# Patient Record
Sex: Female | Born: 1985 | Race: Black or African American | Hispanic: No | Marital: Single | Smoking: Former smoker
Health system: Southern US, Community
[De-identification: ages and names within clinical notes are randomized; demographics above are authoritative.]

## PROBLEM LIST (undated history)

## (undated) DIAGNOSIS — N390 Urinary tract infection, site not specified: Secondary | ICD-10-CM

## (undated) DIAGNOSIS — N83209 Unspecified ovarian cyst, unspecified side: Secondary | ICD-10-CM

## (undated) DIAGNOSIS — R519 Headache, unspecified: Secondary | ICD-10-CM

## (undated) DIAGNOSIS — D649 Anemia, unspecified: Secondary | ICD-10-CM

## (undated) DIAGNOSIS — R87629 Unspecified abnormal cytological findings in specimens from vagina: Secondary | ICD-10-CM

## (undated) DIAGNOSIS — F419 Anxiety disorder, unspecified: Secondary | ICD-10-CM

## (undated) DIAGNOSIS — F32A Depression, unspecified: Secondary | ICD-10-CM

## (undated) HISTORY — PX: WISDOM TOOTH EXTRACTION: SHX21

## (undated) HISTORY — PX: DILATION AND CURETTAGE OF UTERUS: SHX78

---

## 2015-07-14 ENCOUNTER — Other Ambulatory Visit: Payer: Self-pay | Admitting: Occupational Medicine

## 2015-07-14 ENCOUNTER — Ambulatory Visit: Payer: Self-pay

## 2015-07-14 DIAGNOSIS — Z0000 Encounter for general adult medical examination without abnormal findings: Secondary | ICD-10-CM

## 2015-12-29 DIAGNOSIS — Z3042 Encounter for surveillance of injectable contraceptive: Secondary | ICD-10-CM | POA: Diagnosis not present

## 2016-07-02 ENCOUNTER — Other Ambulatory Visit: Payer: Self-pay | Admitting: Occupational Medicine

## 2016-07-02 ENCOUNTER — Ambulatory Visit: Payer: Self-pay

## 2016-07-02 DIAGNOSIS — Z0000 Encounter for general adult medical examination without abnormal findings: Secondary | ICD-10-CM

## 2016-08-11 DIAGNOSIS — R7309 Other abnormal glucose: Secondary | ICD-10-CM | POA: Diagnosis not present

## 2016-08-11 DIAGNOSIS — N926 Irregular menstruation, unspecified: Secondary | ICD-10-CM | POA: Diagnosis not present

## 2016-08-11 DIAGNOSIS — N920 Excessive and frequent menstruation with regular cycle: Secondary | ICD-10-CM | POA: Diagnosis not present

## 2017-02-25 IMAGING — CR DG CHEST 2V
2 series · 2 of 2 positions shown · non-contrast
Comparison: None.

CLINICAL DATA: Physical exam.  Nonsmoker.

EXAM:
CHEST  2 VIEW

[view not recorded (1 of 2)]
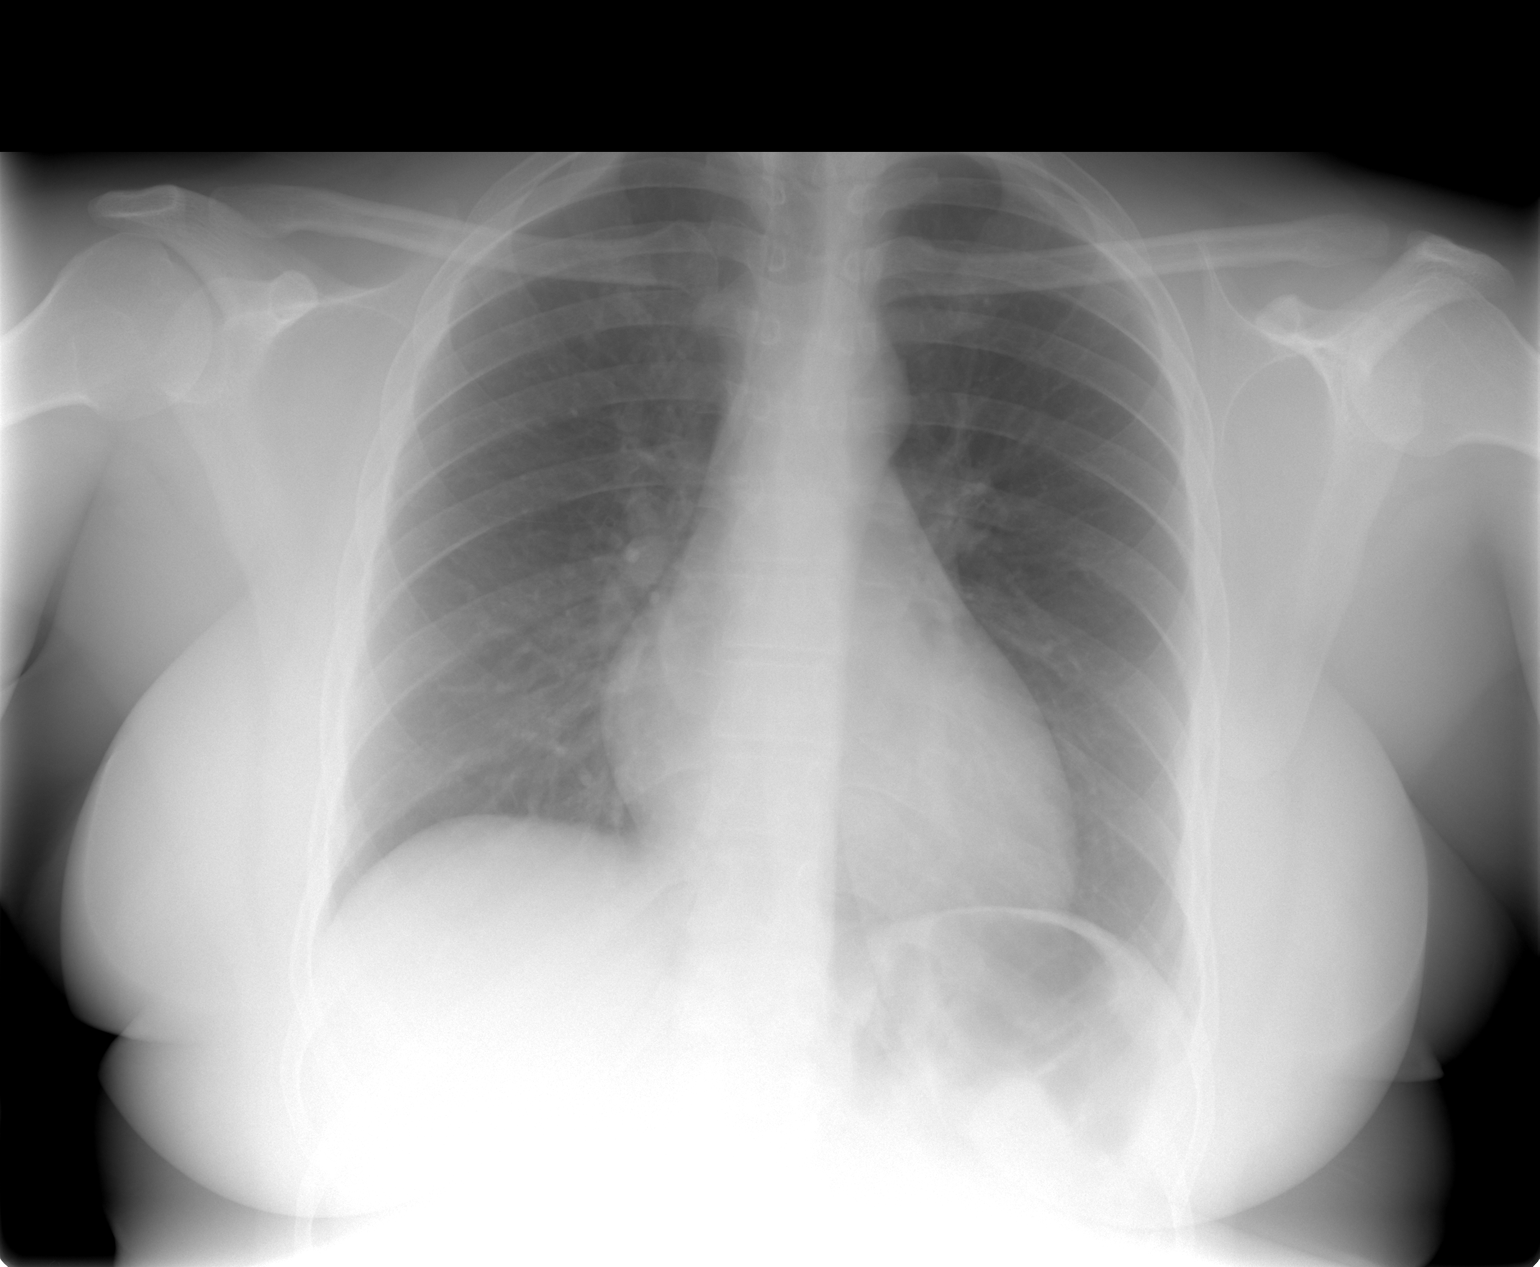

[view not recorded (2 of 2)]
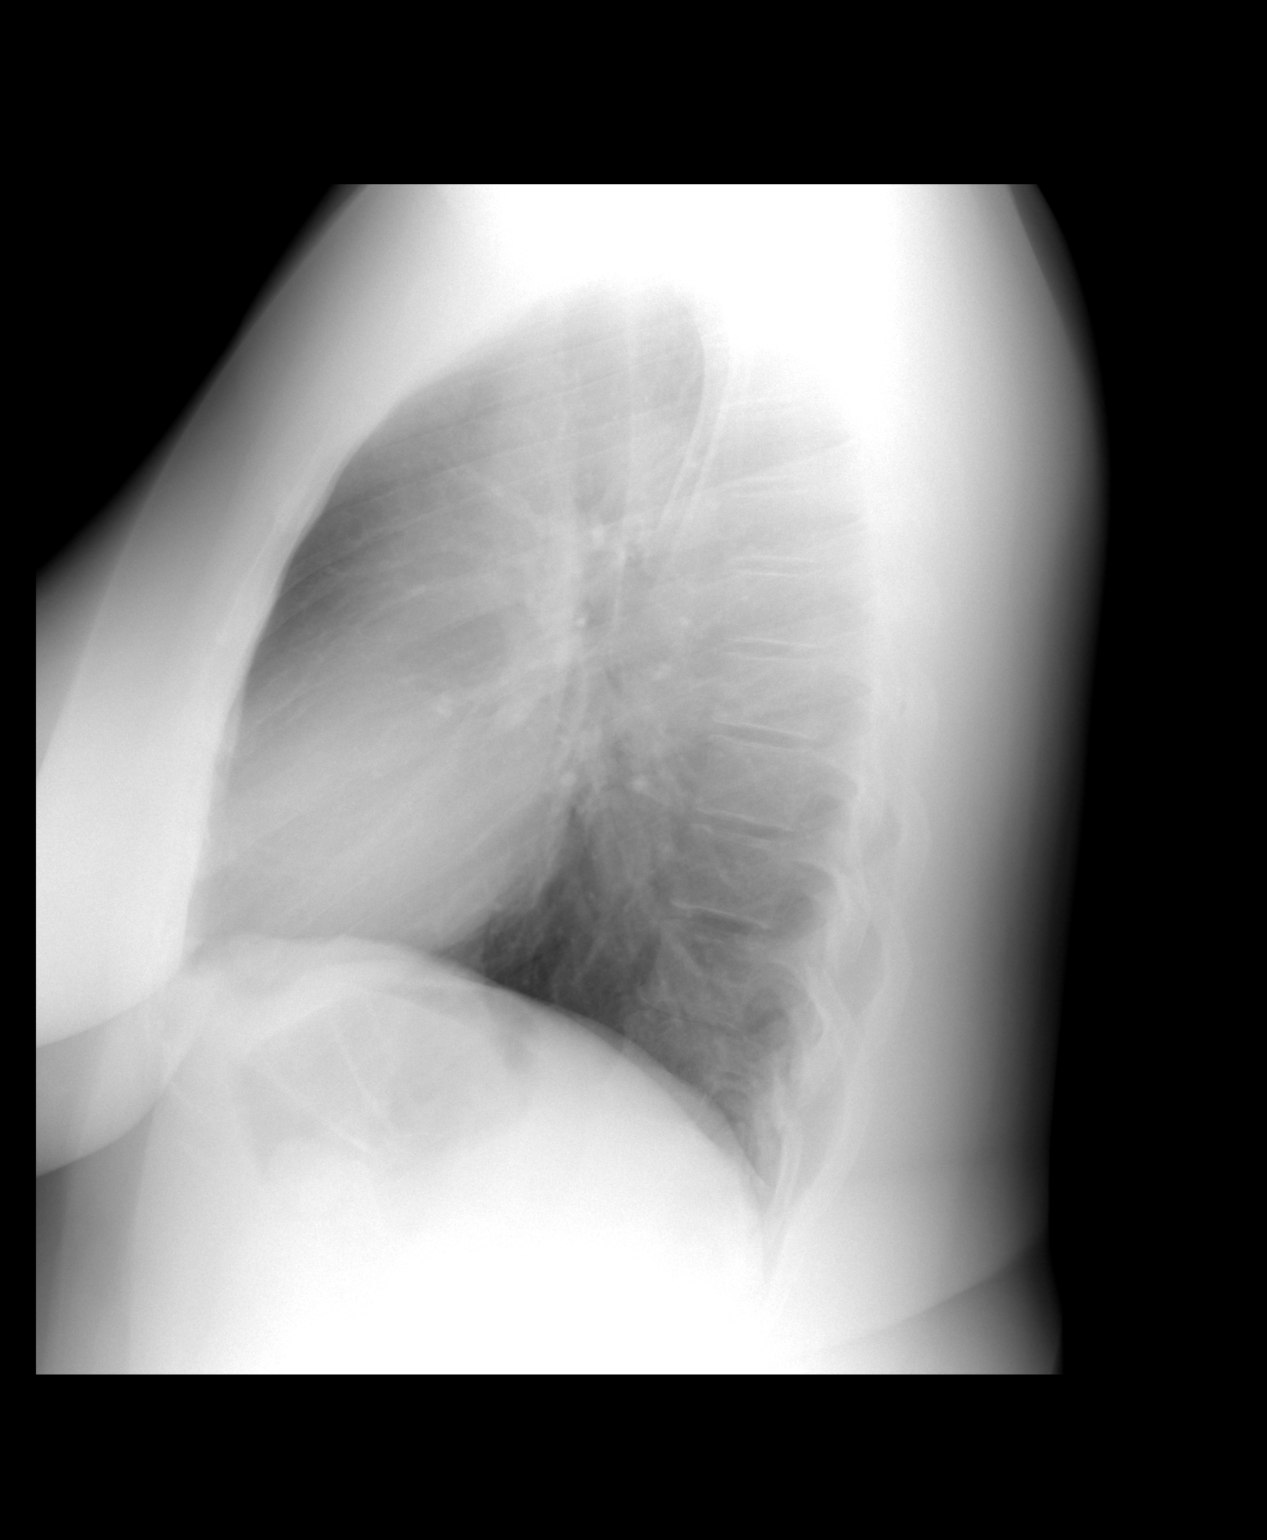

[2 of 2 positions shown; findings below may reference images not displayed]

FINDINGS: The heart size and mediastinal contours are within normal limits.
Both lungs are clear. The visualized skeletal structures are
unremarkable.
IMPRESSION: No active cardiopulmonary disease.

## 2017-05-23 DIAGNOSIS — J0100 Acute maxillary sinusitis, unspecified: Secondary | ICD-10-CM | POA: Diagnosis not present

## 2017-11-11 DIAGNOSIS — Z114 Encounter for screening for human immunodeficiency virus [HIV]: Secondary | ICD-10-CM | POA: Diagnosis not present

## 2017-11-11 DIAGNOSIS — Z13220 Encounter for screening for lipoid disorders: Secondary | ICD-10-CM | POA: Diagnosis not present

## 2017-11-11 DIAGNOSIS — Z131 Encounter for screening for diabetes mellitus: Secondary | ICD-10-CM | POA: Diagnosis not present

## 2017-11-11 DIAGNOSIS — Z1159 Encounter for screening for other viral diseases: Secondary | ICD-10-CM | POA: Diagnosis not present

## 2017-11-11 DIAGNOSIS — Z130 Encounter for screening for diseases of the blood and blood-forming organs and certain disorders involving the immune mechanism: Secondary | ICD-10-CM | POA: Diagnosis not present

## 2017-11-11 DIAGNOSIS — Z01419 Encounter for gynecological examination (general) (routine) without abnormal findings: Secondary | ICD-10-CM | POA: Diagnosis not present

## 2017-11-11 DIAGNOSIS — Z113 Encounter for screening for infections with a predominantly sexual mode of transmission: Secondary | ICD-10-CM | POA: Diagnosis not present

## 2017-11-11 DIAGNOSIS — Z0000 Encounter for general adult medical examination without abnormal findings: Secondary | ICD-10-CM | POA: Diagnosis not present

## 2017-11-11 DIAGNOSIS — Z1151 Encounter for screening for human papillomavirus (HPV): Secondary | ICD-10-CM | POA: Diagnosis not present

## 2017-11-11 DIAGNOSIS — Z118 Encounter for screening for other infectious and parasitic diseases: Secondary | ICD-10-CM | POA: Diagnosis not present

## 2017-11-11 DIAGNOSIS — Z1329 Encounter for screening for other suspected endocrine disorder: Secondary | ICD-10-CM | POA: Diagnosis not present

## 2017-11-11 DIAGNOSIS — Z6835 Body mass index (BMI) 35.0-35.9, adult: Secondary | ICD-10-CM | POA: Diagnosis not present

## 2018-02-14 IMAGING — CR DG CHEST 2V
2 series · 2 of 2 positions shown · non-contrast
Comparison: July 14, 2015

CLINICAL DATA: Physical examination

EXAM:
CHEST  2 VIEW

[view not recorded (1 of 2)]
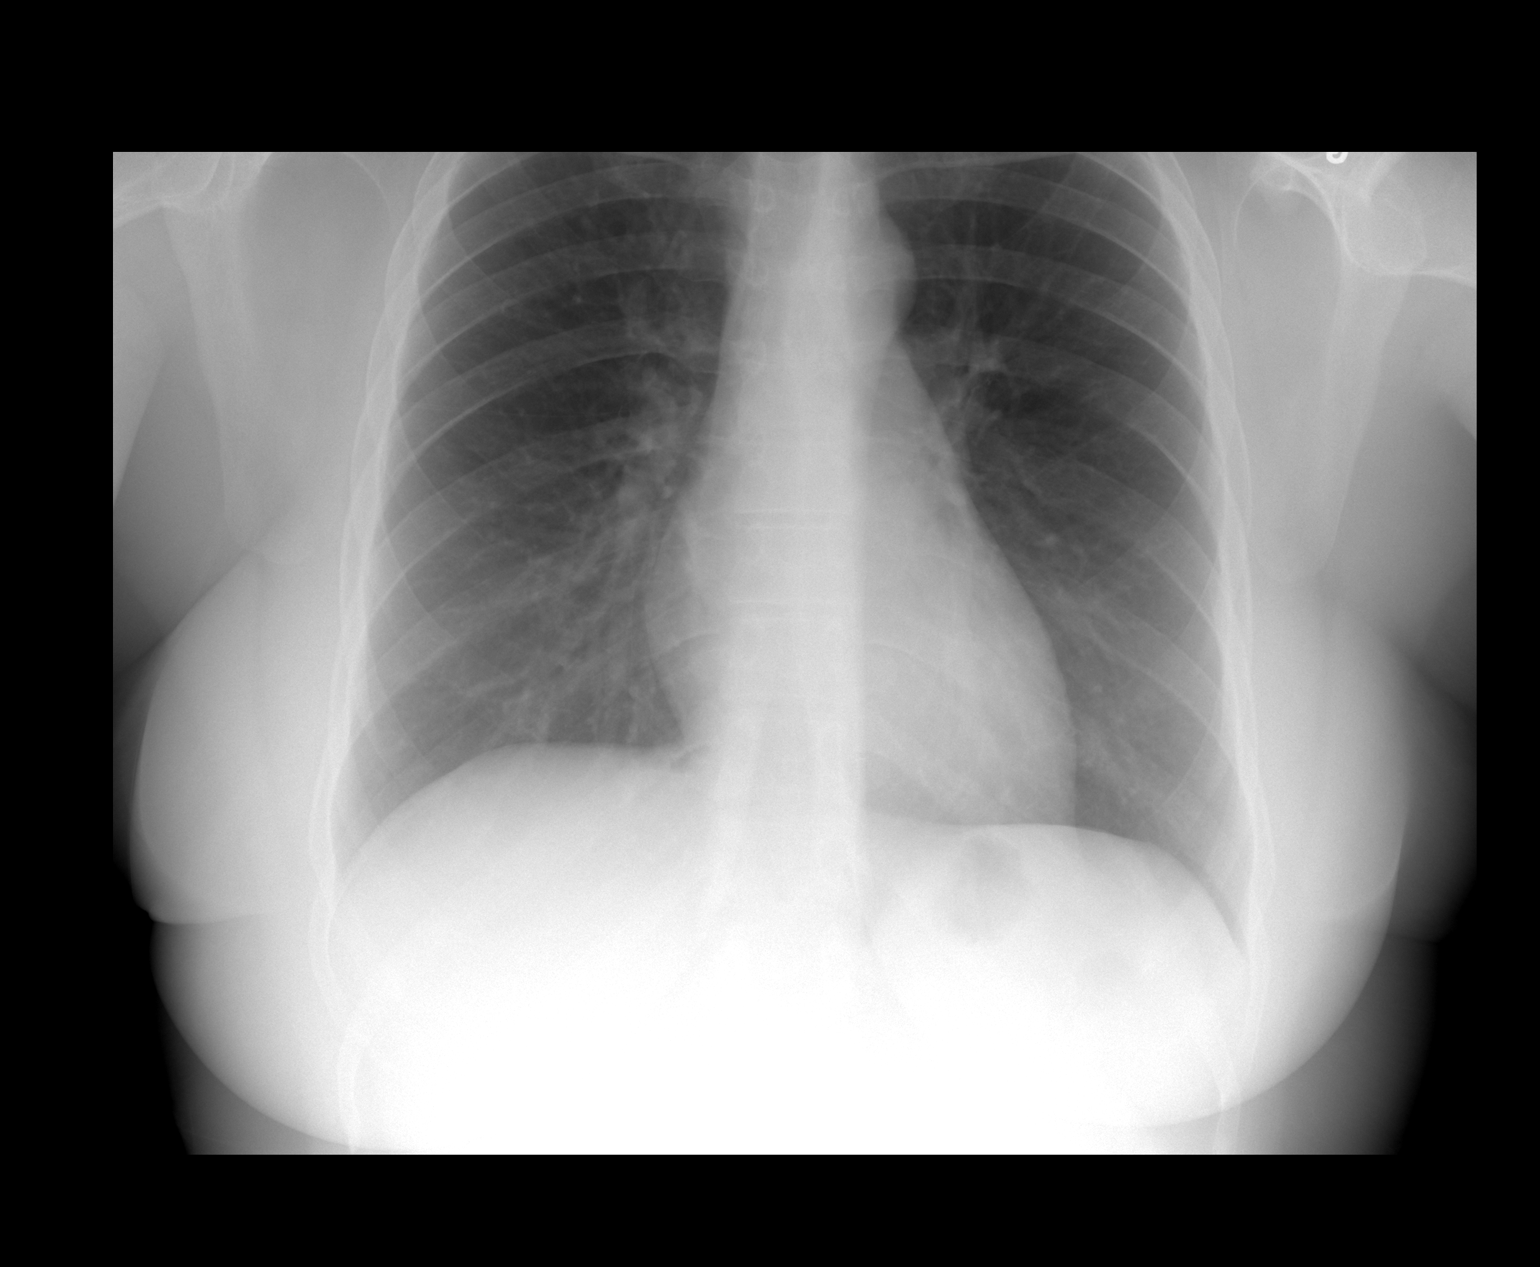

[view not recorded (2 of 2)]
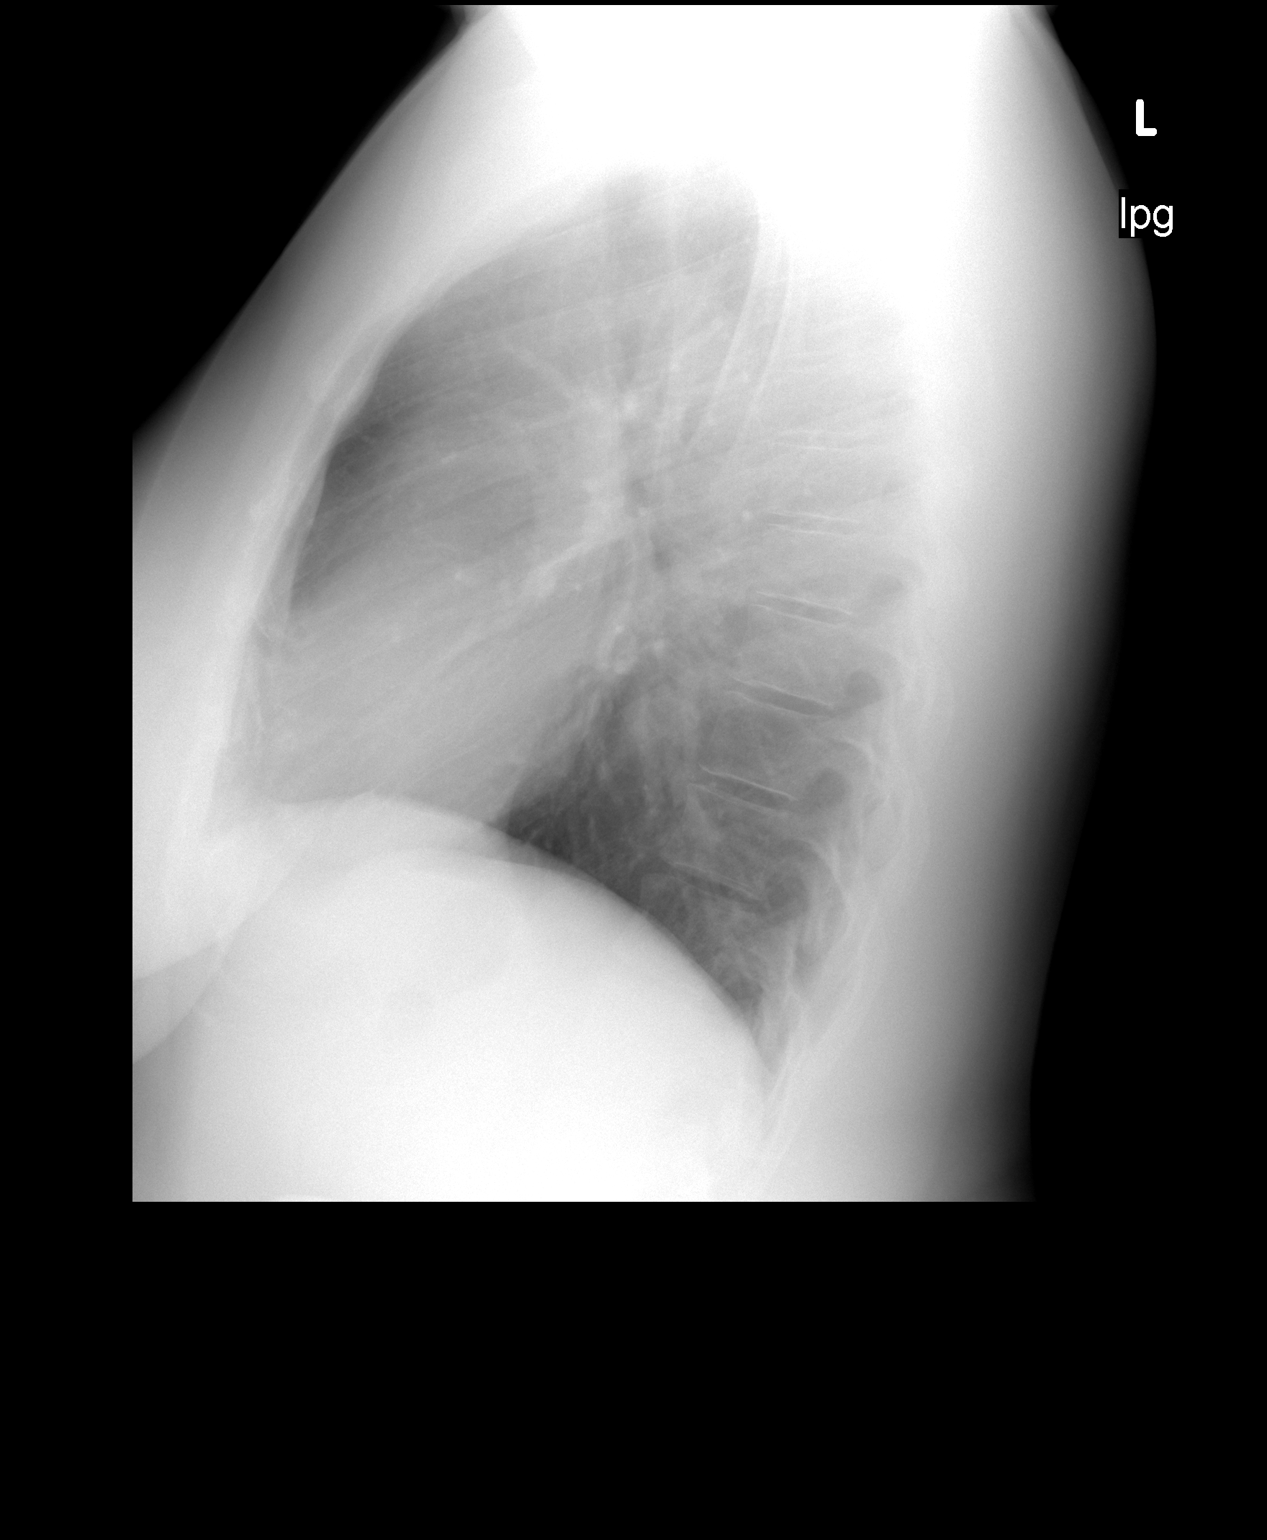

[2 of 2 positions shown; findings below may reference images not displayed]

FINDINGS: Lungs are clear. Heart size and pulmonary vascularity are normal. No
adenopathy. No bone lesions.
IMPRESSION: No abnormality noted.

## 2018-12-19 ENCOUNTER — Other Ambulatory Visit: Payer: Self-pay

## 2018-12-19 ENCOUNTER — Ambulatory Visit
Admission: RE | Admit: 2018-12-19 | Discharge: 2018-12-19 | Disposition: A | Discharge status: Home / Self Care | Payer: BC Managed Care – PPO | Source: Ambulatory Visit | Attending: Family Medicine | Admitting: Family Medicine

## 2018-12-19 ENCOUNTER — Other Ambulatory Visit: Payer: Self-pay | Admitting: Family Medicine

## 2018-12-19 DIAGNOSIS — Z201 Contact with and (suspected) exposure to tuberculosis: Secondary | ICD-10-CM

## 2020-09-05 ENCOUNTER — Other Ambulatory Visit: Payer: Self-pay | Admitting: Obstetrics and Gynecology

## 2020-09-05 DIAGNOSIS — N920 Excessive and frequent menstruation with regular cycle: Secondary | ICD-10-CM

## 2020-09-16 ENCOUNTER — Ambulatory Visit
Admission: RE | Admit: 2020-09-16 | Discharge: 2020-09-16 | Disposition: A | Discharge status: Home / Self Care | Payer: BC Managed Care – PPO | Source: Ambulatory Visit | Attending: Obstetrics and Gynecology | Admitting: Obstetrics and Gynecology

## 2020-09-16 DIAGNOSIS — N920 Excessive and frequent menstruation with regular cycle: Secondary | ICD-10-CM

## 2022-05-01 IMAGING — US US PELVIS COMPLETE WITH TRANSVAGINAL
1 series · 13 of 25 positions shown · non-contrast
Comparison: None

CLINICAL DATA: Menorrhagia with regular cycle, passing clots for 3
months, LMP 08/25/2020



[Series 1: us pelvis complete with transvaginal · 0.26mm/px · 81 acquisitions, 13 frames shown]
[im 1/81]
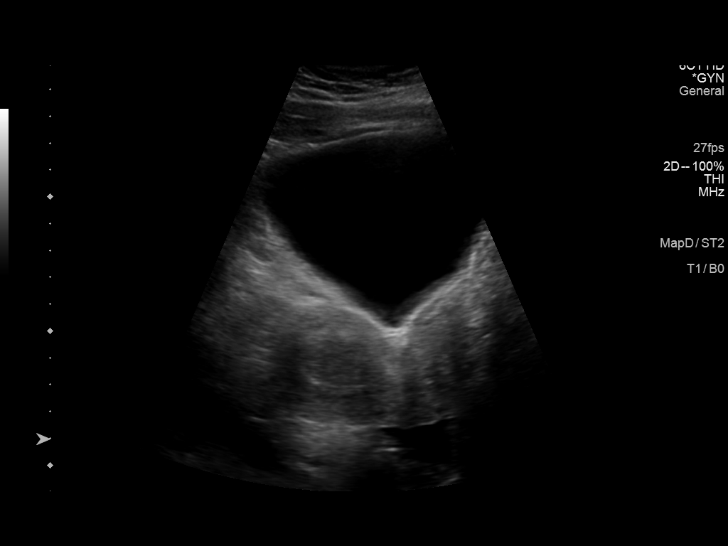
[im 7/81]
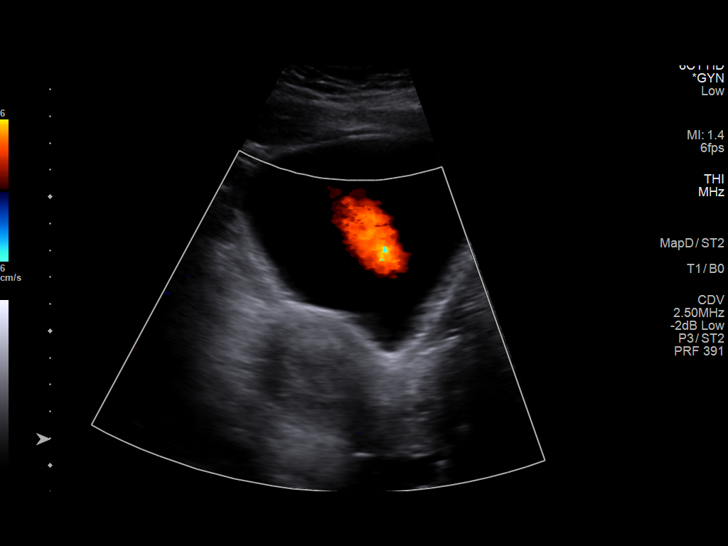
[im 14/81]
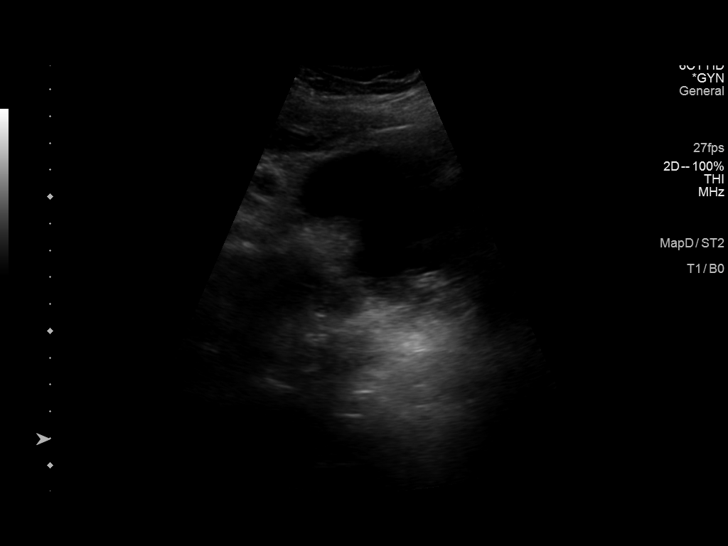
[im 21/81]
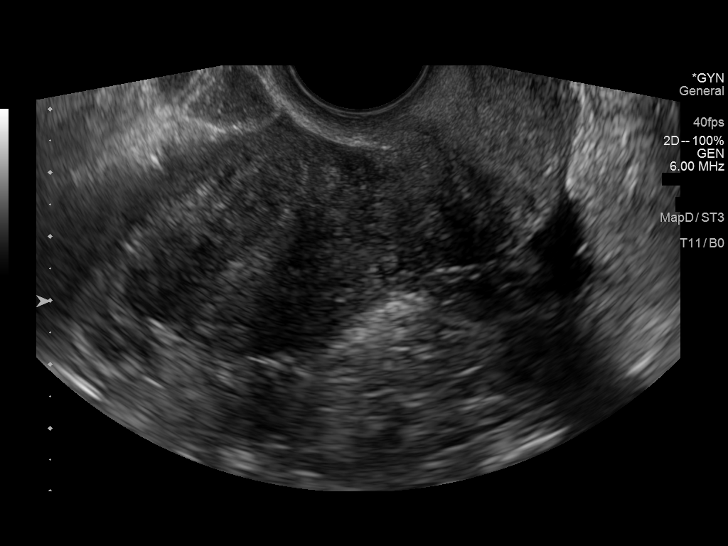
[im 27/81]
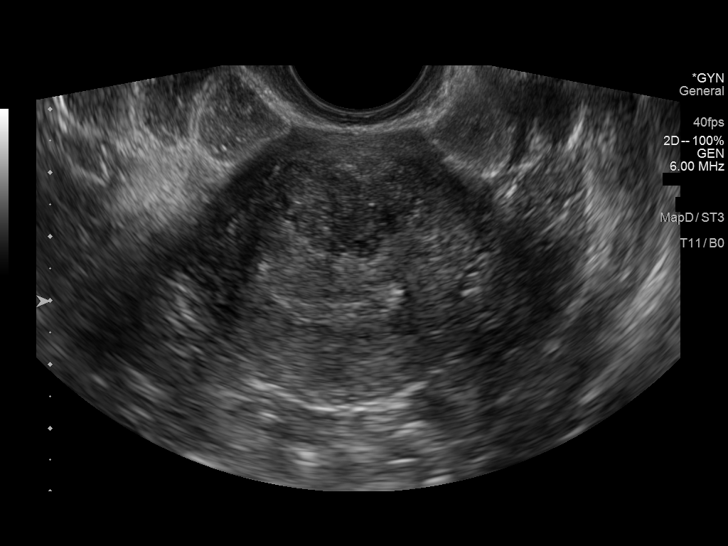
[im 34/81]
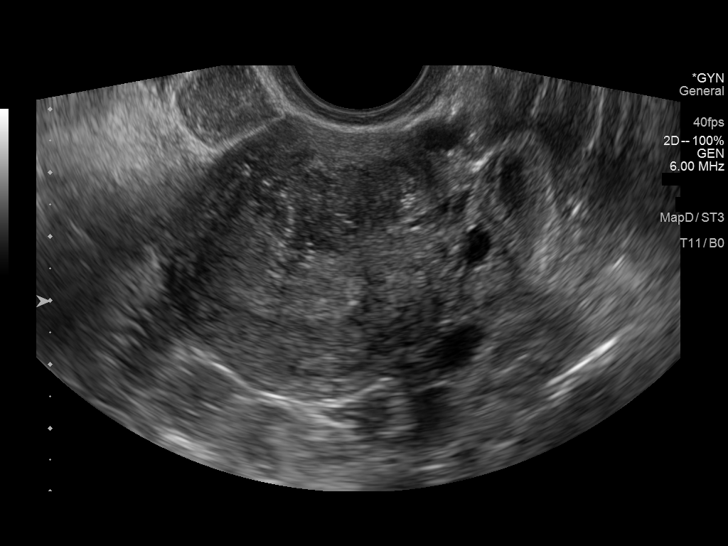
[im 41/81]
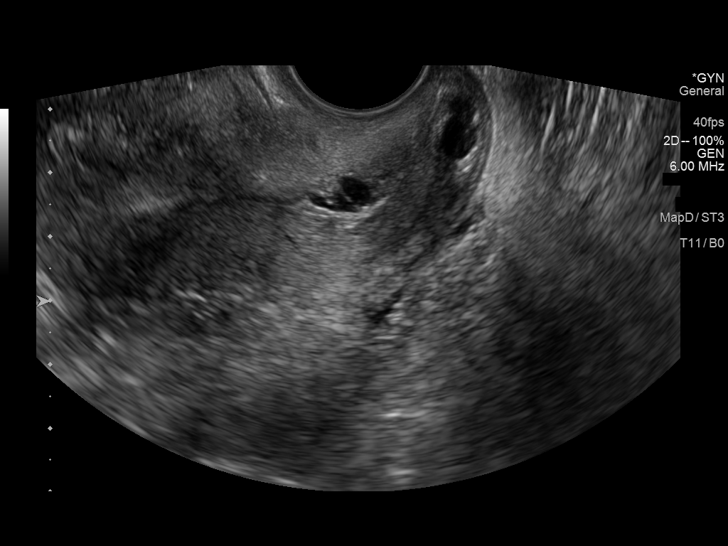
[im 47/81]
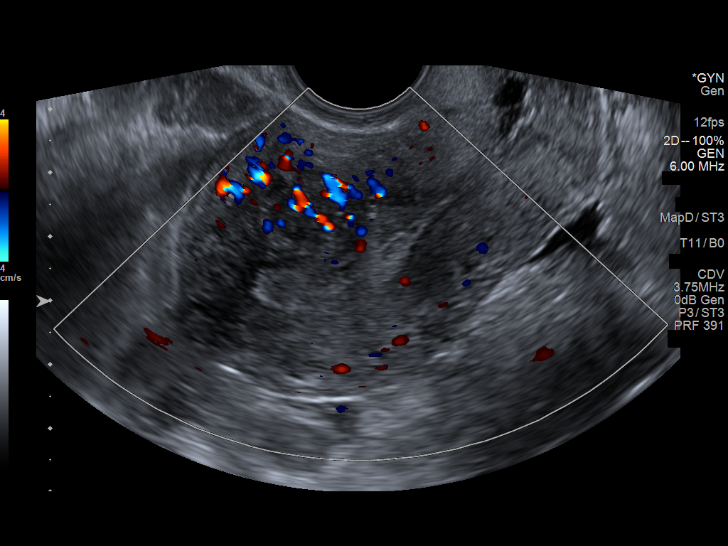
[im 54/81]
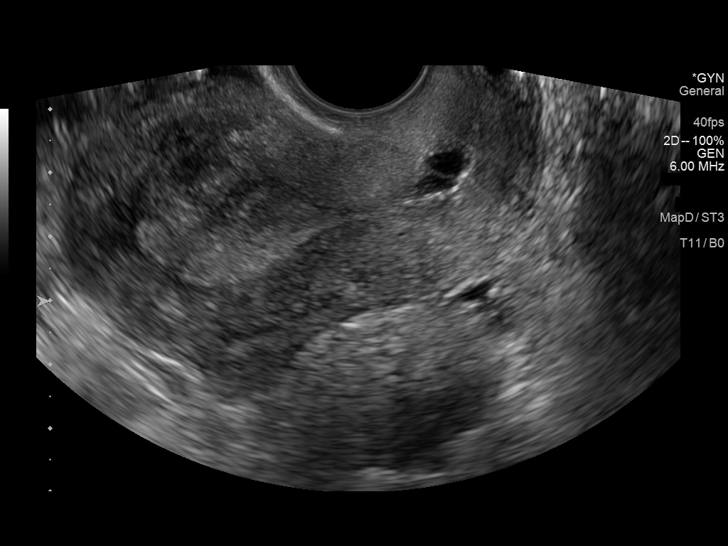
[im 61/81]
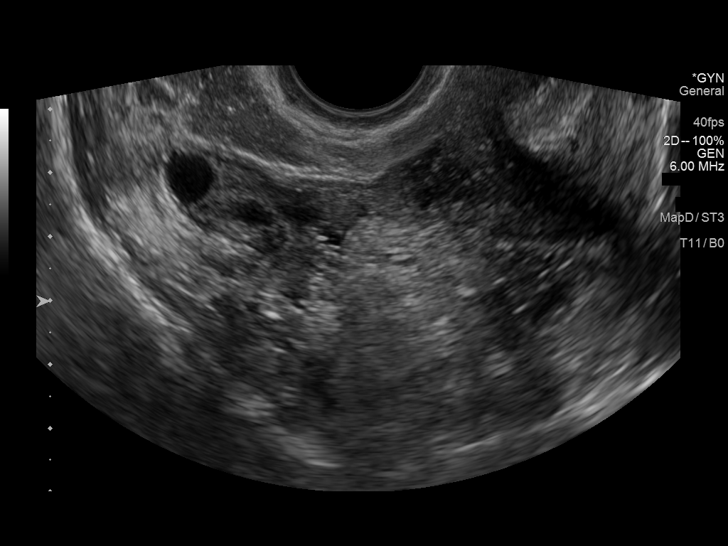
[im 67/81]
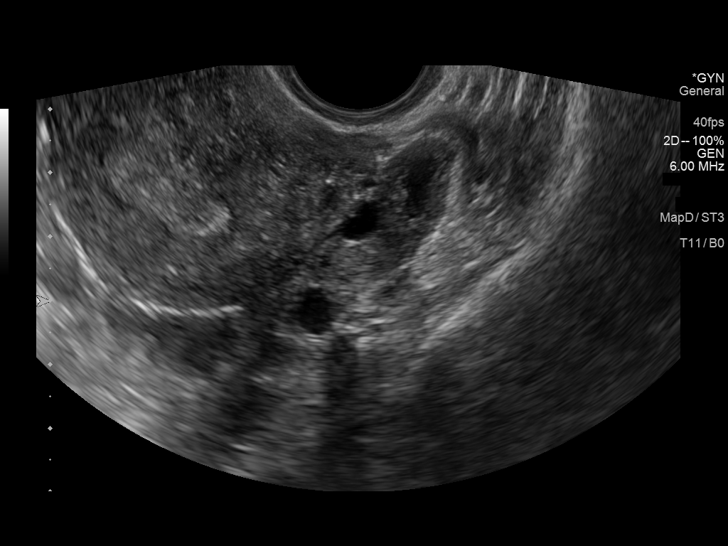
[im 74/81]
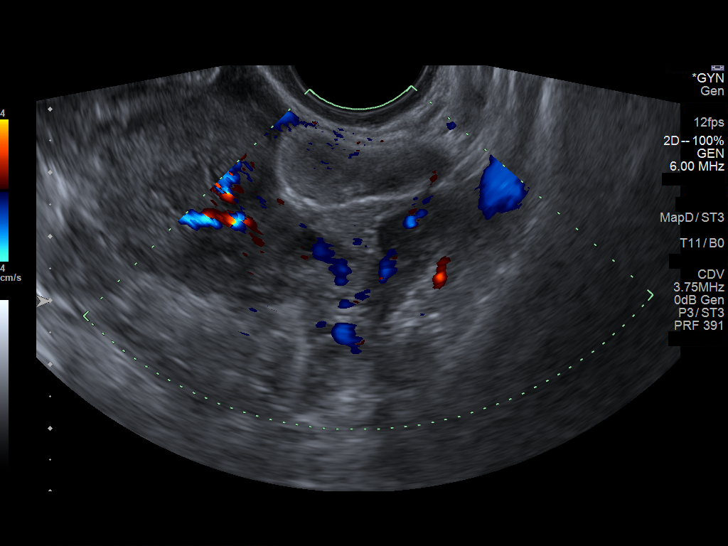
[im 81/81]
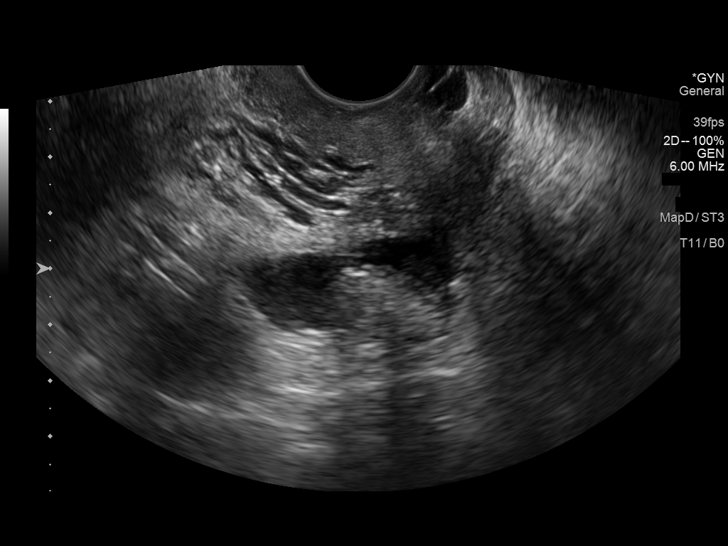

[13 of 25 positions shown; findings below may reference images not displayed]

FINDINGS: Uterus

Measurements: 7.6 x 4.1 x 4.0 cm = volume: 65 mL. Heterogeneous
myometrium anteriorly, could represent a poorly defined leiomyoma
extending 2.6 cm diameter versus an area of focal adenomyosis. This
area is mildly hypervascular on color Doppler imaging. No additional
masses.

Endometrium

Thickness: 10 mm. Ill-defined anterior margin of the endometrial
complex adjacent to the area of myometrial heterogeneity, favor
adenomyosis. No discrete endometrial mass or fluid.

Right ovary

Measurements: 2.6 x 1.0 x 1.7 cm = volume: 2.4 mL. Normal morphology
without mass

Left ovary

Measurements: 3.5 x 1.8 x 1.5 cm and = volume: 5.0 mL. Small corpus
luteum; no follow-up imaging recommended. Otherwise normal normal
morphology without mass

Other findings

Trace free pelvic fluid.  No adnexal masses.
IMPRESSION: Unremarkable ovaries and adnexa.

Ill-defined anterior margin of the endometrial complex favoring
adenomyosis.

Poorly defined 2.6 cm diameter hypoechoic area within anterior
myometrium, associated with ill-defined adjacent margin of the
endometrial complex, favor adenomyosis over ill-defined leiomyoma;
this could be confirmed by MR imaging if clinically indicated.

No other pelvic sonographic abnormalities identified.

## 2023-01-11 ENCOUNTER — Inpatient Hospital Stay (HOSPITAL_COMMUNITY)
Admission: AD | Admit: 2023-01-11 | Discharge: 2023-01-11 | Disposition: A | Discharge status: Home / Self Care | Payer: 59 | Attending: Obstetrics and Gynecology | Admitting: Obstetrics and Gynecology

## 2023-01-11 ENCOUNTER — Encounter (HOSPITAL_COMMUNITY): Payer: Self-pay

## 2023-01-11 DIAGNOSIS — O219 Vomiting of pregnancy, unspecified: Secondary | ICD-10-CM

## 2023-01-11 DIAGNOSIS — Z3A09 9 weeks gestation of pregnancy: Secondary | ICD-10-CM | POA: Insufficient documentation

## 2023-01-11 DIAGNOSIS — O210 Mild hyperemesis gravidarum: Secondary | ICD-10-CM | POA: Diagnosis present

## 2023-01-11 HISTORY — DX: Depression, unspecified: F32.A

## 2023-01-11 HISTORY — DX: Unspecified abnormal cytological findings in specimens from vagina: R87.629

## 2023-01-11 HISTORY — DX: Headache, unspecified: R51.9

## 2023-01-11 HISTORY — DX: Urinary tract infection, site not specified: N39.0

## 2023-01-11 HISTORY — DX: Anxiety disorder, unspecified: F41.9

## 2023-01-11 HISTORY — DX: Unspecified ovarian cyst, unspecified side: N83.209

## 2023-01-11 HISTORY — DX: Anemia, unspecified: D64.9

## 2023-01-11 LAB — URINALYSIS, ROUTINE W REFLEX MICROSCOPIC
Bacteria, UA: NONE SEEN
Bilirubin Urine: NEGATIVE
Glucose, UA: NEGATIVE mg/dL
Hgb urine dipstick: NEGATIVE
Ketones, ur: NEGATIVE mg/dL
Nitrite: NEGATIVE
Protein, ur: NEGATIVE mg/dL
Specific Gravity, Urine: 1.012 (ref 1.005–1.030)
pH: 6 (ref 5.0–8.0)

## 2023-01-11 MED ORDER — SCOPOLAMINE 1 MG/3DAYS TD PT72: 2.0000 | MEDICATED_PATCH | TRANSDERMAL | 12 refills | Status: DC

## 2023-01-11 MED ORDER — SCOPOLAMINE 1 MG/3DAYS TD PT72
1.0000 | MEDICATED_PATCH | TRANSDERMAL | Status: DC
  Administered 2023-01-11: 1.5 mg via TRANSDERMAL
  Filled 2023-01-11: qty 1

## 2023-01-11 MED ORDER — ONDANSETRON HCL 4 MG PO TABS: 4.0000 mg | ORAL_TABLET | Freq: Three times a day (TID) | ORAL | 1 refills | Status: DC | PRN

## 2023-01-11 MED ORDER — LACTATED RINGERS IV BOLUS
1000.0000 mL | Freq: Once | INTRAVENOUS | Status: AC
  Administered 2023-01-11: 1000 mL via INTRAVENOUS

## 2023-01-11 MED ORDER — SODIUM CHLORIDE 0.9 % IV SOLN
8.0000 mg | Freq: Once | INTRAVENOUS | Status: AC
  Administered 2023-01-11: 8 mg via INTRAVENOUS
  Filled 2023-01-11: qty 4

## 2023-01-11 NOTE — MAU Provider Note (Signed)
History     Chief Complaint  Patient presents with   Nausea   Emesis   37 yo G2P0010 BF @ [redacted] wk gestation sent for IVF due to complaint of inability to keep anything down as well as Nausea and vomiting  OB History     Gravida  2   Para      Term      Preterm      AB  1   Living         SAB      IAB  1   Ectopic      Multiple      Live Births              No past medical history on file.    No family history on file.     Allergies: Not on File  No medications prior to admission.     Physical Exam   Blood pressure (!) 128/90, pulse 88, temperature 98.2 F (36.8 C), temperature source Oral, resp. rate 16, height 5\' 3"  (1.6 m), weight 81.7 kg, SpO2 99%.  General appearance: alert, cooperative, and no distress Lungs: clear to auscultation bilaterally Heart: regular rate and rhythm, S1, S2 normal, no murmur, click, rub or gallop Extremities: extremities normal, atraumatic, no cyanosis or edema  Recent Results (from the past 2160 hour(s))  Urinalysis, Routine w reflex microscopic -Urine, Clean Catch     Status: Abnormal   Collection Time: 01/11/23  2:32 PM  Result Value Ref Range   Color, Urine YELLOW YELLOW   APPearance CLEAR CLEAR   Specific Gravity, Urine 1.012 1.005 - 1.030   pH 6.0 5.0 - 8.0   Glucose, UA NEGATIVE NEGATIVE mg/dL   Hgb urine dipstick NEGATIVE NEGATIVE   Bilirubin Urine NEGATIVE NEGATIVE   Ketones, ur NEGATIVE NEGATIVE mg/dL   Protein, ur NEGATIVE NEGATIVE mg/dL   Nitrite NEGATIVE NEGATIVE   Leukocytes,Ua TRACE (A) NEGATIVE   RBC / HPF 6-10 0 - 5 RBC/hpf   WBC, UA 0-5 0 - 5 WBC/hpf   Bacteria, UA NONE SEEN NONE SEEN   Squamous Epithelial / HPF 0-5 0 - 5 /HPF    Comment: Performed at [facility], [address], [place], [place] [address]   IMP: Nausea and vomiting affecting pregnancy IUP @ 9 wk P) 2L IVF. Zofran 8 mg IVP. Scopolamine patch  ED Course   Addendum: improved sx after hydration D/c home Advised  on fluid intake F/u OB appt MDM  Serita Kyle, MD 1:06 PM 01/11/2023

## 2023-01-11 NOTE — MAU Note (Signed)
.  Alison Hill is a 37 y.o. at [redacted]w[redacted]d here in MAU reporting: Sent over per Dr. Cherly Hensen for fluids and IV Zofran. She reports ongoing nausea and vomiting. Denies VB or LOF.   LMP: 11/06/2022 - EDD changed to 08/16/2023 per patient from [place] Scan done in office.   Onset of complaint:  Pain score: Denies pain.  FHT: n/a Lab orders placed from triage:  UA, 2 Liters of LR, and 8 mg of Zofran.

## 2023-01-26 ENCOUNTER — Encounter (HOSPITAL_COMMUNITY): Payer: Self-pay | Admitting: Obstetrics and Gynecology

## 2023-01-26 ENCOUNTER — Inpatient Hospital Stay (HOSPITAL_COMMUNITY)
Admission: AD | Admit: 2023-01-26 | Discharge: 2023-01-26 | Disposition: A | Discharge status: Home / Self Care | Payer: 59 | Attending: Obstetrics and Gynecology | Admitting: Obstetrics and Gynecology

## 2023-01-26 DIAGNOSIS — O26891 Other specified pregnancy related conditions, first trimester: Secondary | ICD-10-CM | POA: Insufficient documentation

## 2023-01-26 DIAGNOSIS — O219 Vomiting of pregnancy, unspecified: Secondary | ICD-10-CM | POA: Diagnosis not present

## 2023-01-26 DIAGNOSIS — Z3A09 9 weeks gestation of pregnancy: Secondary | ICD-10-CM | POA: Diagnosis not present

## 2023-01-26 LAB — URINALYSIS, ROUTINE W REFLEX MICROSCOPIC
Bacteria, UA: NONE SEEN
Bilirubin Urine: NEGATIVE
Glucose, UA: NEGATIVE mg/dL
Ketones, ur: 5 mg/dL — AB
Nitrite: NEGATIVE
Protein, ur: 30 mg/dL — AB
Specific Gravity, Urine: 1.026 (ref 1.005–1.030)
pH: 5 (ref 5.0–8.0)

## 2023-01-26 MED ORDER — LACTATED RINGERS IV BOLUS
1000.0000 mL | Freq: Once | INTRAVENOUS | Status: AC
  Administered 2023-01-26: 1000 mL via INTRAVENOUS

## 2023-01-26 MED ORDER — SODIUM CHLORIDE 0.9 % IV SOLN
8.0000 mg | Freq: Once | INTRAVENOUS | Status: AC
  Administered 2023-01-26: 8 mg via INTRAVENOUS
  Filled 2023-01-26: qty 4

## 2023-01-26 NOTE — Progress Notes (Signed)
Dr. Cherly Hensen informed of pt arrival & status.  RN to call back with UA results.

## 2023-01-26 NOTE — MAU Note (Signed)
Alison Hill is a 37 y.o. at [redacted]w[redacted]d here in MAU reporting: "HG really got her down". been really nauseous the last 3 days. Throwing up really bad yesterday, unable to keep anything down last night.. No pain, or bleeding.  Onset of complaint: 3 days ago, worse last night  Vitals:   01/26/23 1003  BP: (!) 128/93  Pulse: 96  Resp: 16  Temp: 98.6 F (37 C)  SpO2: 100%     ZOX:WRUE dress on, will check in rm Lab orders placed from triage:

## 2023-01-26 NOTE — MAU Provider Note (Signed)
History     Chief Complaint  Patient presents with   Emesis   Nausea   37 yo G2P0010  BF @ 11 1/[redacted] wk gestation presents with c/o inability to keep anything down  (+) N/V  OB History     Gravida  2   Para      Term      Preterm      AB  1   Living         SAB      IAB  1   Ectopic      Multiple      Live Births              Past Medical History:  Diagnosis Date   Anemia    Anxiety    Depression    Headache    Ovarian cyst    UTI (urinary tract infection)    Vaginal Pap smear, abnormal    leep/biopsy;ok since    Past Surgical History:  Procedure Laterality Date   DILATION AND CURETTAGE OF UTERUS     WISDOM TOOTH EXTRACTION Bilateral    all 4    Family History  Problem Relation Age of Onset   Varicose Veins Mother    Hypertension Mother    Obesity Mother    Diabetes Mother    Heart disease Mother    Drug abuse Mother        recovering   Depression Mother    Anxiety disorder Mother    Mental illness Mother    Hypertension Father    Alcohol abuse Father    Depression Father     Social History   Tobacco Use   Smoking status: Former    Types: Cigarettes   Smokeless tobacco: Never   Tobacco comments:    Smoked in college, stopped age 65  Vaping Use   Vaping status: Former  Substance Use Topics   Alcohol use: Not Currently    Comment: rare   Drug use: Never    Allergies:  Allergies  Allergen Reactions   Pear     Itchy swelling in throat and mouth   Latex Dermatitis    Rash/itchy    Medications Prior to Admission  Medication Sig Dispense Refill Last Dose   Folic Acid-Vit B6-Vit B12 (FOLBEE) 2.5-25-1 MG TABS tablet Take 1 tablet by mouth 2 (two) times daily.   01/25/2023   ondansetron (ZOFRAN) 4 MG tablet Take 1 tablet (4 mg total) by mouth every 8 (eight) hours as needed for nausea or vomiting. 30 tablet 1 01/26/2023   Prenatal Vit-Fe Fumarate-FA (MULTIVITAMIN-PRENATAL) 27-0.8 MG TABS tablet Take 1 tablet by mouth daily at 12  noon.   01/25/2023   scopolamine (TRANSDERM-SCOP) 1 MG/3DAYS Place 2 patches (3 mg total) onto the skin every 3 (three) days. 10 patch 12 01/26/2023   buPROPion (WELLBUTRIN XL) 150 MG 24 hr tablet Take 150 mg by mouth daily.      FLUoxetine (PROZAC) 40 MG capsule Take 40 mg by mouth daily.   01/23/2023     Physical Exam   Blood pressure 127/82, pulse 96, temperature 98.6 F (37 C), temperature source Oral, resp. rate 17, height 5\' 3"  (1.6 m), weight 79.4 kg, SpO2 100%.  General appearance: cooperative and appears stated age Lungs: clear to auscultation bilaterally Heart: regular rate and rhythm, S1, S2 normal, no murmur, click, rub or gallop Abdomen (+) FHR  ED Course  IMP: N/v affecting pregnancy P) u/a, IVF bolus ,  IV Zofran. Will do bonjesta on outpatient basis. Crackers and ginger ale D/c home MDM   Serita Kyle, MD 12:55 PM 01/26/2023

## 2023-03-03 ENCOUNTER — Encounter (HOSPITAL_COMMUNITY): Payer: Self-pay | Admitting: Obstetrics and Gynecology

## 2023-03-03 ENCOUNTER — Inpatient Hospital Stay (HOSPITAL_COMMUNITY)
Admission: AD | Admit: 2023-03-03 | Discharge: 2023-03-03 | Disposition: A | Discharge status: Home / Self Care | Payer: 59 | Attending: Obstetrics and Gynecology | Admitting: Obstetrics and Gynecology

## 2023-03-03 ENCOUNTER — Other Ambulatory Visit: Payer: Self-pay

## 2023-03-03 DIAGNOSIS — O211 Hyperemesis gravidarum with metabolic disturbance: Secondary | ICD-10-CM

## 2023-03-03 DIAGNOSIS — Z3A16 16 weeks gestation of pregnancy: Secondary | ICD-10-CM | POA: Diagnosis not present

## 2023-03-03 DIAGNOSIS — O99612 Diseases of the digestive system complicating pregnancy, second trimester: Secondary | ICD-10-CM | POA: Diagnosis present

## 2023-03-03 DIAGNOSIS — K5900 Constipation, unspecified: Secondary | ICD-10-CM | POA: Insufficient documentation

## 2023-03-03 LAB — URINALYSIS, ROUTINE W REFLEX MICROSCOPIC
Bilirubin Urine: NEGATIVE
Glucose, UA: NEGATIVE mg/dL
Ketones, ur: 80 mg/dL — AB
Nitrite: NEGATIVE
Protein, ur: 30 mg/dL — AB
Specific Gravity, Urine: 1.027 (ref 1.005–1.030)
pH: 5 (ref 5.0–8.0)

## 2023-03-03 LAB — COMPREHENSIVE METABOLIC PANEL
ALT: 18 U/L (ref 0–44)
AST: 19 U/L (ref 15–41)
Albumin: 3.1 g/dL — ABNORMAL LOW (ref 3.5–5.0)
Alkaline Phosphatase: 40 U/L (ref 38–126)
Anion gap: 13 (ref 5–15)
BUN: 5 mg/dL — ABNORMAL LOW (ref 6–20)
CO2: 19 mmol/L — ABNORMAL LOW (ref 22–32)
Calcium: 9.7 mg/dL (ref 8.9–10.3)
Chloride: 101 mmol/L (ref 98–111)
Creatinine, Ser: 0.62 mg/dL (ref 0.44–1.00)
GFR, Estimated: >60 mL/min (ref >60)
Glucose, Bld: 89 mg/dL (ref 70–99)
Potassium: 3.8 mmol/L (ref 3.5–5.1)
Sodium: 133 mmol/L — ABNORMAL LOW (ref 135–145)
Total Bilirubin: 0.4 mg/dL (ref 0.3–1.2)
Total Protein: 6.4 g/dL — ABNORMAL LOW (ref 6.5–8.1)

## 2023-03-03 LAB — TSH: TSH: 0.515 u[IU]/mL (ref 0.350–4.500)

## 2023-03-03 LAB — T4, FREE: Free T4: 0.87 ng/dL (ref 0.61–1.12)

## 2023-03-03 MED ORDER — METOCLOPRAMIDE HCL 5 MG/ML IJ SOLN
10.0000 mg | Freq: Once | INTRAMUSCULAR | Status: AC
  Administered 2023-03-03: 10 mg via INTRAVENOUS
  Filled 2023-03-03: qty 2

## 2023-03-03 MED ORDER — SORBITOL 70 % SOLN
960.0000 mL | TOPICAL_OIL | Freq: Once | ORAL | Status: AC
  Administered 2023-03-03: 960 mL via RECTAL
  Filled 2023-03-03: qty 240

## 2023-03-03 MED ORDER — LACTATED RINGERS IV BOLUS
2000.0000 mL | Freq: Once | INTRAVENOUS | Status: AC
  Administered 2023-03-03: 2000 mL via INTRAVENOUS

## 2023-03-03 NOTE — MAU Note (Signed)
Alison Hill is a 37 y.o. at [redacted]w[redacted]d here in MAU reporting: she's here constipation and vomiting.  Reports she last had a BM 6 days ago.  States she's been drinking prune juice, taking Miralax, and stool softeners without relief.   Reports constipation is causing vomiting, has vomited 4-6 times in past 24 hours.  Denies VB or LOF.   LMP: NA Onset of complaint: 6 days Pain score: 2 Vitals:   03/03/23 1705  BP: 128/84  Pulse: 97  Resp: 18  Temp: 97.9 F (36.6 C)  SpO2: 100%     [id number] d/t maternal apparel Lab orders placed from triage:   UA

## 2023-03-03 NOTE — Progress Notes (Signed)
Administered SMOG enema, pt able to tolerate approx 100cc

## 2023-03-03 NOTE — MAU Provider Note (Signed)
History     Chief Complaint  Patient presents with   Constipation   Emesis    37 yo G2P0010 SF @ 16 2/[redacted] wk gestation presents with c/o constipation for 6 days. Pt c/o vomiting 4-5 x/ d. She has not been to work. Pt stopped taking prozac due to inability to use Zofran. She is under the care of psychiatry through her job. Pt is currently using Zofran, reglan and scopolamine patch OB History     Gravida  2   Para      Term      Preterm      AB  1   Living         SAB      IAB  1   Ectopic      Multiple      Live Births              Past Medical History:  Diagnosis Date   Anemia    Anxiety    Depression    Headache    Ovarian cyst    UTI (urinary tract infection)    Vaginal Pap smear, abnormal    leep/biopsy;ok since    Past Surgical History:  Procedure Laterality Date   DILATION AND CURETTAGE OF UTERUS     WISDOM TOOTH EXTRACTION Bilateral    all 4    Family History  Problem Relation Age of Onset   Varicose Veins Mother    Hypertension Mother    Obesity Mother    Diabetes Mother    Heart disease Mother    Drug abuse Mother        recovering   Depression Mother    Anxiety disorder Mother    Mental illness Mother    Hypertension Father    Alcohol abuse Father    Depression Father     Social History   Tobacco Use   Smoking status: Former    Types: Cigarettes   Smokeless tobacco: Never   Tobacco comments:    Smoked in college, stopped age 74  Vaping Use   Vaping status: Former  Substance Use Topics   Alcohol use: Not Currently    Comment: rare   Drug use: Never    Allergies:  Allergies  Allergen Reactions   Pear     Itchy swelling in throat and mouth   Latex Dermatitis    Rash/itchy    Medications Prior to Admission  Medication Sig Dispense Refill Last Dose   Folic Acid-Vit B6-Vit B12 (FOLBEE) 2.5-25-1 MG TABS tablet Take 1 tablet by mouth 2 (two) times daily.   03/02/2023   metoCLOPramide (REGLAN) 10 MG tablet Take 10  mg by mouth 4 (four) times daily.   03/03/2023 at 1500   ondansetron (ZOFRAN) 4 MG tablet Take 1 tablet (4 mg total) by mouth every 8 (eight) hours as needed for nausea or vomiting. 30 tablet 1 03/03/2023 at 1500   Prenatal Vit-Fe Fumarate-FA (MULTIVITAMIN-PRENATAL) 27-0.8 MG TABS tablet Take 1 tablet by mouth daily at 12 noon.   03/02/2023   buPROPion (WELLBUTRIN XL) 150 MG 24 hr tablet Take 150 mg by mouth daily.      FLUoxetine (PROZAC) 40 MG capsule Take 40 mg by mouth daily.      scopolamine (TRANSDERM-SCOP) 1 MG/3DAYS Place 2 patches (3 mg total) onto the skin every 3 (three) days. 10 patch 12      Physical Exam   Blood pressure 128/84, pulse 97, temperature 97.9 F (36.6 C),  temperature source Oral, resp. rate 18, height 5\' 3"  (1.6 m), weight 77 kg, SpO2 100%.  General appearance: alert, cooperative, and no distress Lungs: clear to auscultation bilaterally Heart: regular rate and rhythm, S1, S2 normal, no murmur, click, rub or gallop  Extremities: extremities normal, atraumatic, no cyanosis or edema Abd (+) FHR  Results for orders placed or performed during the hospital encounter of 03/03/23 (from the past 24 hour(s))  Urinalysis, Routine w reflex microscopic -Urine, Clean Catch     Status: Abnormal   Collection Time: 03/03/23  4:29 PM  Result Value Ref Range   Color, Urine YELLOW YELLOW   APPearance CLEAR CLEAR   Specific Gravity, Urine 1.027 1.005 - 1.030   pH 5.0 5.0 - 8.0   Glucose, UA NEGATIVE NEGATIVE mg/dL   Hgb urine dipstick MODERATE (A) NEGATIVE   Bilirubin Urine NEGATIVE NEGATIVE   Ketones, ur 80 (A) NEGATIVE mg/dL   Protein, ur 30 (A) NEGATIVE mg/dL   Nitrite NEGATIVE NEGATIVE   Leukocytes,Ua SMALL (A) NEGATIVE   RBC / HPF 0-5 0 - 5 RBC/hpf   WBC, UA 0-5 0 - 5 WBC/hpf   Bacteria, UA RARE (A) NONE SEEN   Squamous Epithelial / HPF 0-5 0 - 5 /HPF   Mucus PRESENT   Comprehensive metabolic panel     Status: Abnormal   Collection Time: 03/03/23  6:42 PM  Result  Value Ref Range   Sodium 133 (L) 135 - 145 mmol/L   Potassium 3.8 3.5 - 5.1 mmol/L   Chloride 101 98 - 111 mmol/L   CO2 19 (L) 22 - 32 mmol/L   Glucose, Bld 89 70 - 99 mg/dL   BUN 5 (L) 6 - 20 mg/dL   Creatinine, Ser 4.54 0.44 - 1.00 mg/dL   Calcium 9.7 8.9 - 09.8 mg/dL   Total Protein 6.4 (L) 6.5 - 8.1 g/dL   Albumin 3.1 (L) 3.5 - 5.0 g/dL   AST 19 15 - 41 U/L   ALT 18 0 - 44 U/L   Alkaline Phosphatase 40 38 - 126 U/L   Total Bilirubin 0.4 0.3 - 1.2 mg/dL   GFR, Estimated >11 >91 mL/min   Anion gap 13 5 - 15  T4, free     Status: None   Collection Time: 03/03/23  6:42 PM  Result Value Ref Range   Free T4 0.87 0.61 - 1.12 ng/dL  TSH     Status: None   Collection Time: 03/03/23  6:42 PM  Result Value Ref Range   TSH 0.515 0.350 - 4.500 uIU/mL    ED Course  Constipation affecting pregnancy HEG IUP @ 16 2/7 wk P) SMOG. CMP. TSH. IVFx 2L. Reglan IV. Recommend daily miralax, stool softener twice a day. Pt will check in with psychiatrist regarding medication. Also she will check with her insurance regarding home fluid therapy D/c home MDM   Serita Kyle, MD 6:03 PM 03/03/2023

## 2023-06-17 ENCOUNTER — Other Ambulatory Visit: Payer: Self-pay | Admitting: Certified Nurse Midwife

## 2023-06-17 DIAGNOSIS — O99013 Anemia complicating pregnancy, third trimester: Secondary | ICD-10-CM

## 2023-06-23 ENCOUNTER — Non-Acute Institutional Stay (HOSPITAL_COMMUNITY)
Admission: RE | Admit: 2023-06-23 | Discharge: 2023-06-23 | Disposition: A | Discharge status: Home / Self Care | Payer: 59 | Source: Ambulatory Visit | Attending: Internal Medicine | Admitting: Internal Medicine

## 2023-06-23 DIAGNOSIS — O99013 Anemia complicating pregnancy, third trimester: Secondary | ICD-10-CM | POA: Insufficient documentation

## 2023-06-23 DIAGNOSIS — Z3A00 Weeks of gestation of pregnancy not specified: Secondary | ICD-10-CM | POA: Diagnosis not present

## 2023-06-23 DIAGNOSIS — D649 Anemia, unspecified: Secondary | ICD-10-CM | POA: Insufficient documentation

## 2023-06-23 MED ORDER — IRON SUCROSE 500 MG IVPB - SIMPLE MED
500.0000 mg | INTRAVENOUS | Status: DC
  Administered 2023-06-23: 500 mg via INTRAVENOUS
  Filled 2023-06-23: qty 275

## 2023-06-23 MED ORDER — SODIUM CHLORIDE 0.9 % IV SOLN: INTRAVENOUS | Status: DC | PRN

## 2023-06-23 MED ORDER — ACETAMINOPHEN 500 MG PO TABS
500.0000 mg | ORAL_TABLET | ORAL | Status: DC
  Administered 2023-06-23: 500 mg via ORAL
  Filled 2023-06-23: qty 1

## 2023-06-23 MED ORDER — DIPHENHYDRAMINE HCL 25 MG PO CAPS
25.0000 mg | ORAL_CAPSULE | ORAL | Status: DC
  Administered 2023-06-23: 25 mg via ORAL
  Filled 2023-06-23: qty 1

## 2023-06-23 NOTE — Progress Notes (Signed)
 PATIENT CARE CENTER NOTE  Diagnosis: Anemia complicating pregnancy in third trimester [O99.013]    Provider: Joshua Alan POUR, CNM   Procedure: Venofer  500 mg (dose #1 of 2)  Note: Patient received Venofer  infusion via PIV. Pt pre-medicated per orders with PO Tylenol  and PO Benadryl . Pt tolerated infusion with no adverse reaction. AVS printed and given to patient. Pt advised to schedule her next infusion appointment for 2 weeks at the front desk. Patient is alert, oriented, and ambulatory at discharge.

## 2023-07-07 ENCOUNTER — Non-Acute Institutional Stay (HOSPITAL_COMMUNITY)
Admission: RE | Admit: 2023-07-07 | Discharge: 2023-07-07 | Disposition: A | Discharge status: Home / Self Care | Payer: 59 | Source: Ambulatory Visit | Attending: Internal Medicine | Admitting: Internal Medicine

## 2023-07-07 DIAGNOSIS — O99013 Anemia complicating pregnancy, third trimester: Secondary | ICD-10-CM | POA: Diagnosis present

## 2023-07-07 MED ORDER — SODIUM CHLORIDE 0.9 % IV SOLN: INTRAVENOUS | Status: DC | PRN

## 2023-07-07 MED ORDER — IRON SUCROSE 500 MG IVPB - SIMPLE MED
500.0000 mg | Freq: Once | INTRAVENOUS | Status: AC
  Administered 2023-07-07: 500 mg via INTRAVENOUS
  Filled 2023-07-07: qty 500

## 2023-07-07 MED ORDER — ACETAMINOPHEN 500 MG PO TABS
500.0000 mg | ORAL_TABLET | Freq: Once | ORAL | Status: AC
  Administered 2023-07-07: 500 mg via ORAL
  Filled 2023-07-07: qty 1

## 2023-07-07 MED ORDER — DIPHENHYDRAMINE HCL 25 MG PO CAPS
25.0000 mg | ORAL_CAPSULE | Freq: Once | ORAL | Status: AC
  Administered 2023-07-07: 25 mg via ORAL
  Filled 2023-07-07: qty 1

## 2023-07-07 NOTE — Progress Notes (Signed)
PATIENT CARE CENTER NOTE   Diagnosis: Anemia complicating pregnancy in third trimester [O99.013]    Provider: June Leap, CNM    Procedure: Venofer 500 mg (dose # 2 of 2)   Note: Patient received Venofer infusion via PIV. Patient pre-medicated per orders with PO Tylenol and PO Benadryl. Patient tolerated infusion with no adverse reaction. AVS printed and given to patient. Patient is alert, oriented, and ambulatory at discharge.

## 2023-07-29 ENCOUNTER — Telehealth (HOSPITAL_COMMUNITY): Payer: Self-pay | Admitting: *Deleted

## 2023-07-29 ENCOUNTER — Encounter (HOSPITAL_COMMUNITY): Payer: Self-pay | Admitting: *Deleted

## 2023-07-29 NOTE — Telephone Encounter (Signed)
 Preadmission screen

## 2023-08-02 ENCOUNTER — Inpatient Hospital Stay (HOSPITAL_COMMUNITY): Payer: 59 | Admitting: Anesthesiology

## 2023-08-02 ENCOUNTER — Inpatient Hospital Stay (HOSPITAL_COMMUNITY)
Admission: RE | Admit: 2023-08-02 | Discharge: 2023-08-07 | DRG: 788 | Disposition: A | Discharge status: Home / Self Care | Payer: 59 | Attending: Obstetrics & Gynecology | Admitting: Obstetrics & Gynecology

## 2023-08-02 ENCOUNTER — Encounter (HOSPITAL_COMMUNITY): Payer: Self-pay | Admitting: Obstetrics & Gynecology

## 2023-08-02 DIAGNOSIS — Z79899 Other long term (current) drug therapy: Secondary | ICD-10-CM | POA: Diagnosis not present

## 2023-08-02 DIAGNOSIS — Z8249 Family history of ischemic heart disease and other diseases of the circulatory system: Secondary | ICD-10-CM

## 2023-08-02 DIAGNOSIS — Z818 Family history of other mental and behavioral disorders: Secondary | ICD-10-CM | POA: Diagnosis not present

## 2023-08-02 DIAGNOSIS — Z98891 History of uterine scar from previous surgery: Secondary | ICD-10-CM

## 2023-08-02 DIAGNOSIS — Z23 Encounter for immunization: Secondary | ICD-10-CM

## 2023-08-02 DIAGNOSIS — Z833 Family history of diabetes mellitus: Secondary | ICD-10-CM

## 2023-08-02 DIAGNOSIS — Z87891 Personal history of nicotine dependence: Secondary | ICD-10-CM | POA: Diagnosis not present

## 2023-08-02 DIAGNOSIS — F419 Anxiety disorder, unspecified: Secondary | ICD-10-CM | POA: Diagnosis present

## 2023-08-02 DIAGNOSIS — F32A Depression, unspecified: Secondary | ICD-10-CM | POA: Diagnosis present

## 2023-08-02 DIAGNOSIS — O99344 Other mental disorders complicating childbirth: Secondary | ICD-10-CM | POA: Diagnosis present

## 2023-08-02 DIAGNOSIS — O134 Gestational [pregnancy-induced] hypertension without significant proteinuria, complicating childbirth: Secondary | ICD-10-CM | POA: Diagnosis present

## 2023-08-02 DIAGNOSIS — O139 Gestational [pregnancy-induced] hypertension without significant proteinuria, unspecified trimester: Secondary | ICD-10-CM | POA: Diagnosis present

## 2023-08-02 DIAGNOSIS — Z3490 Encounter for supervision of normal pregnancy, unspecified, unspecified trimester: Principal | ICD-10-CM

## 2023-08-02 DIAGNOSIS — Z3A38 38 weeks gestation of pregnancy: Secondary | ICD-10-CM

## 2023-08-02 DIAGNOSIS — O34211 Maternal care for low transverse scar from previous cesarean delivery: Secondary | ICD-10-CM | POA: Diagnosis not present

## 2023-08-02 LAB — CBC
HCT: 37.4 % (ref 36.0–46.0)
HCT: 35.9 % — ABNORMAL LOW (ref 36.0–46.0)
Hemoglobin: 12.4 g/dL (ref 12.0–15.0)
Hemoglobin: 11.7 g/dL — ABNORMAL LOW (ref 12.0–15.0)
MCH: 27.9 pg (ref 26.0–34.0)
MCH: 27.4 pg (ref 26.0–34.0)
MCHC: 33.2 g/dL (ref 30.0–36.0)
MCHC: 32.6 g/dL (ref 30.0–36.0)
MCV: 84.2 fL (ref 80.0–100.0)
MCV: 84.1 fL (ref 80.0–100.0)
Platelets: 411 10*3/uL — ABNORMAL HIGH (ref 150–400)
Platelets: 369 10*3/uL (ref 150–400)
RBC: 4.44 MIL/uL (ref 3.87–5.11)
RBC: 4.27 MIL/uL (ref 3.87–5.11)
RDW: 22 % — ABNORMAL HIGH (ref 11.5–15.5)
RDW: 22.1 % — ABNORMAL HIGH (ref 11.5–15.5)
WBC: 8 10*3/uL (ref 4.0–10.5)
WBC: 12.6 10*3/uL — ABNORMAL HIGH (ref 4.0–10.5)
nRBC: 0 % (ref 0.0–0.2)
nRBC: 0 % (ref 0.0–0.2)

## 2023-08-02 LAB — COMPREHENSIVE METABOLIC PANEL
ALT: 10 U/L (ref 0–44)
AST: 25 U/L (ref 15–41)
Albumin: 2.8 g/dL — ABNORMAL LOW (ref 3.5–5.0)
Alkaline Phosphatase: 111 U/L (ref 38–126)
Anion gap: 10 (ref 5–15)
BUN: 6 mg/dL (ref 6–20)
CO2: 16 mmol/L — ABNORMAL LOW (ref 22–32)
Calcium: 9.4 mg/dL (ref 8.9–10.3)
Chloride: 107 mmol/L (ref 98–111)
Creatinine, Ser: 0.87 mg/dL (ref 0.44–1.00)
GFR, Estimated: >60 mL/min (ref >60)
Glucose, Bld: 104 mg/dL — ABNORMAL HIGH (ref 70–99)
Potassium: 3.6 mmol/L (ref 3.5–5.1)
Sodium: 133 mmol/L — ABNORMAL LOW (ref 135–145)
Total Bilirubin: 0.4 mg/dL (ref 0.0–1.2)
Total Protein: 6.3 g/dL — ABNORMAL LOW (ref 6.5–8.1)

## 2023-08-02 LAB — OB RESULTS CONSOLE RPR: RPR: NONREACTIVE

## 2023-08-02 LAB — TYPE AND SCREEN
ABO/RH(D): O POS
Antibody Screen: NEGATIVE

## 2023-08-02 LAB — RPR: RPR Ser Ql: NONREACTIVE

## 2023-08-02 LAB — HEPATITIS C ANTIBODY: HCV Ab: NEGATIVE

## 2023-08-02 LAB — PROTEIN / CREATININE RATIO, URINE
Creatinine, Urine: 47 mg/dL
Total Protein, Urine: <6 mg/dL

## 2023-08-02 LAB — OB RESULTS CONSOLE HEPATITIS B SURFACE ANTIGEN: Hepatitis B Surface Ag: NEGATIVE

## 2023-08-02 LAB — OB RESULTS CONSOLE HIV ANTIBODY (ROUTINE TESTING): HIV: NONREACTIVE

## 2023-08-02 LAB — OB RESULTS CONSOLE ABO/RH: RH Type: POSITIVE

## 2023-08-02 LAB — OB RESULTS CONSOLE RUBELLA ANTIBODY, IGM: Rubella: NON-IMMUNE/NOT IMMUNE

## 2023-08-02 LAB — OB RESULTS CONSOLE GBS: GBS: NEGATIVE

## 2023-08-02 MED ORDER — FENTANYL CITRATE (PF) 100 MCG/2ML IJ SOLN
INTRAMUSCULAR | Status: AC
  Filled 2023-08-02: qty 2

## 2023-08-02 MED ORDER — DIPHENHYDRAMINE HCL 50 MG/ML IJ SOLN: 12.5000 mg | INTRAMUSCULAR | Status: DC | PRN

## 2023-08-02 MED ORDER — OXYTOCIN BOLUS FROM INFUSION: 333.0000 mL | Freq: Once | INTRAVENOUS | Status: DC

## 2023-08-02 MED ORDER — PHENYLEPHRINE 80 MCG/ML (10ML) SYRINGE FOR IV PUSH (FOR BLOOD PRESSURE SUPPORT): 80.0000 ug | PREFILLED_SYRINGE | INTRAVENOUS | Status: DC | PRN

## 2023-08-02 MED ORDER — LACTATED RINGERS IV SOLN: 500.0000 mL | Freq: Once | INTRAVENOUS | Status: DC

## 2023-08-02 MED ORDER — FLEET ENEMA RE ENEM: 1.0000 | ENEMA | RECTAL | Status: DC | PRN

## 2023-08-02 MED ORDER — OXYTOCIN-SODIUM CHLORIDE 30-0.9 UT/500ML-% IV SOLN: 2.5000 [IU]/h | INTRAVENOUS | Status: DC

## 2023-08-02 MED ORDER — LACTATED RINGERS IV SOLN: INTRAVENOUS | Status: DC

## 2023-08-02 MED ORDER — FENTANYL CITRATE (PF) 100 MCG/2ML IJ SOLN
100.0000 ug | Freq: Once | INTRAMUSCULAR | Status: AC
  Administered 2023-08-02: 100 ug via INTRAVENOUS
  Filled 2023-08-02: qty 2

## 2023-08-02 MED ORDER — ACETAMINOPHEN 325 MG PO TABS: 650.0000 mg | ORAL_TABLET | ORAL | Status: DC | PRN

## 2023-08-02 MED ORDER — LIDOCAINE HCL (PF) 1 % IJ SOLN
INTRAMUSCULAR | Status: DC | PRN
  Administered 2023-08-02 (×2): 4 mL via EPIDURAL

## 2023-08-02 MED ORDER — TERBUTALINE SULFATE 1 MG/ML IJ SOLN
0.2500 mg | Freq: Once | INTRAMUSCULAR | Status: AC | PRN
  Administered 2023-08-02: 0.25 mg via SUBCUTANEOUS
  Filled 2023-08-02: qty 1

## 2023-08-02 MED ORDER — EPHEDRINE 5 MG/ML INJ: 10.0000 mg | INTRAVENOUS | Status: DC | PRN

## 2023-08-02 MED ORDER — LIDOCAINE HCL (PF) 1 % IJ SOLN: 30.0000 mL | INTRAMUSCULAR | Status: DC | PRN

## 2023-08-02 MED ORDER — ONDANSETRON HCL 4 MG/2ML IJ SOLN
4.0000 mg | Freq: Four times a day (QID) | INTRAMUSCULAR | Status: DC | PRN
  Administered 2023-08-02: 4 mg via INTRAVENOUS
  Filled 2023-08-02 (×2): qty 2

## 2023-08-02 MED ORDER — MISOPROSTOL 50MCG HALF TABLET
50.0000 ug | ORAL_TABLET | ORAL | Status: DC | PRN
  Administered 2023-08-02 (×2): 50 ug via BUCCAL
  Filled 2023-08-02 (×2): qty 1

## 2023-08-02 MED ORDER — OXYCODONE-ACETAMINOPHEN 5-325 MG PO TABS: 1.0000 | ORAL_TABLET | ORAL | Status: DC | PRN

## 2023-08-02 MED ORDER — FENTANYL CITRATE (PF) 100 MCG/2ML IJ SOLN
100.0000 ug | INTRAMUSCULAR | Status: DC | PRN
  Administered 2023-08-02: 100 ug via INTRAVENOUS

## 2023-08-02 MED ORDER — SOD CITRATE-CITRIC ACID 500-334 MG/5ML PO SOLN
30.0000 mL | ORAL | Status: DC | PRN
  Administered 2023-08-03: 30 mL via ORAL
  Filled 2023-08-02: qty 30

## 2023-08-02 MED ORDER — OXYCODONE-ACETAMINOPHEN 5-325 MG PO TABS: 2.0000 | ORAL_TABLET | ORAL | Status: DC | PRN

## 2023-08-02 MED ORDER — FENTANYL-BUPIVACAINE-NACL 0.5-0.125-0.9 MG/250ML-% EP SOLN
12.0000 mL/h | EPIDURAL | Status: DC | PRN
  Administered 2023-08-02: 12 mL/h via EPIDURAL
  Filled 2023-08-02: qty 250

## 2023-08-02 MED ORDER — LACTATED RINGERS IV SOLN: 500.0000 mL | INTRAVENOUS | Status: DC | PRN

## 2023-08-02 NOTE — Progress Notes (Signed)
S: Anxious and crying. Attempted SVE for AROM after consent but patient revoked consent due to anxiety. Sister, Kaleen Odea, and friend, Cristie Hem, present and supportive.   O: Vitals:   08/02/23 1810 08/02/23 1836 08/02/23 1924 08/02/23 2025  BP: 121/80  139/76 (!) 146/79  Pulse: (!) 115  (!) 118 (!) 125  Resp:  16 16 16   Temp:   98 F (36.7 C)   TempSrc:   Oral   Weight:      Height:       FHT:  FHR: 160 bpm, variability: minimal, accelerations: Absent, decelerations: Absent UC:   irregular, every 2-6 minutes SVE:   Deferred due to anxiety  A / P: Induction of labor due to gestational hypertension, s/p 2 doses of buccal Cytotec and Foley balloon  Fetal Wellbeing:  Category II PEC: BPs labile, labs WNL, denies PEC symptoms GBS: Negative Pain Control:  IV pain meds Anticipated MOD:   Guarded but working towards  NSVB  Recommend epidural placement due to maternal anxiety. Patient is considering.   Dr. Wendall Stade updated on patient status, Cat II FHT, and plan of care.   June Leap, CNM, MSN 08/02/2023, 9:33 PM

## 2023-08-02 NOTE — Anesthesia Procedure Notes (Signed)
Epidural Patient location during procedure: OB Start time: 08/02/2023 10:12 PM End time: 08/02/2023 10:17 PM  Staffing Anesthesiologist: Linton Rump, MD Performed: anesthesiologist   Preanesthetic Checklist Completed: patient identified, IV checked, site marked, risks and benefits discussed, surgical consent, monitors and equipment checked, pre-op evaluation and timeout performed  Epidural Patient position: sitting Prep: DuraPrep and site prepped and draped Patient monitoring: continuous pulse ox and blood pressure Approach: midline Location: L3-L4 Injection technique: LOR saline  Needle:  Needle type: Tuohy  Needle gauge: 17 G Needle length: 9 cm and 9 Needle insertion depth: 6 cm Catheter type: closed end flexible Catheter size: 19 Gauge Catheter at skin depth: 10 cm Test dose: negative  Assessment Events: blood not aspirated, no cerebrospinal fluid, injection not painful, no injection resistance, no paresthesia and negative IV test  Additional Notes The patient has requested an epidural for labor pain management. Risks and benefits including, but not limited to, infection, bleeding, local anesthetic toxicity, headache, hypotension, back pain, block failure, etc. were discussed with the patient. The patient expressed understanding and consented to the procedure. I confirmed that the patient has no bleeding disorders and is not taking blood thinners. I confirmed the patient's last platelet count with the nurse. A time-out was performed immediately prior to the procedure. Please see nursing documentation for vital signs. Sterile technique was used throughout the whole procedure. Once LOR achieved, the epidural catheter threaded easily without resistance. Aspiration of the catheter was negative for blood and CSF. The epidural was dosed slowly and an infusion was started.  1 attempt(s)Reason for block:procedure for pain

## 2023-08-02 NOTE — Progress Notes (Signed)
S: Comfortable with epidural. Family at bedside. Consented for AROM.   O: Vitals:   08/02/23 2237 08/02/23 2241 08/02/23 2246 08/02/23 2251  BP: 117/72 121/77 133/81   Pulse: (!) 117 (!) 114 (!) 110   Resp: 16 16    Temp:  98.2 F (36.8 C)    TempSrc:  Oral    SpO2:  100% 100% 100%  Weight:      Height:       FHT:  FHR: 155 bpm, variability: moderate,  accelerations:  Present,  decelerations:  Present variable decels UC:   regular, every 1-2 minutes SVE:   Dilation: 6 Effacement (%): 50 Exam by:: Juwuan Sedita,CNM  AROM of a large amount of blood tinged fluid at 2248.   A / P: Induction of labor due to gestational hypertension, s/p 2 doses of buccal Cytotec, Foley balloon, now AROMed  Fetal Wellbeing:  Category I and Category II PEC: BPs WNL, labs stable, denies PEC symptoms GBS: Negative Pain Control:  Epidural Anticipated MOD:  NSVD  Plan expectant management. May consider Pitocin if contractions space out.   Dr. Wendall Stade updated on patient status and plan of care.   June Leap, CNM, MSN 08/02/2023, 10:59 PM

## 2023-08-02 NOTE — Anesthesia Preprocedure Evaluation (Signed)
Anesthesia Evaluation  Patient identified by MRN, date of birth, ID band Patient awake    Reviewed: Allergy & Precautions, NPO status , Patient's Chart, lab work & pertinent test results  History of Anesthesia Complications Negative for: history of anesthetic complications  Airway Mallampati: III  TM Distance: >3 FB Neck ROM: Full    Dental   Pulmonary former smoker   Pulmonary exam normal breath sounds clear to auscultation       Cardiovascular hypertension (gestational),  Rhythm:Regular Rate:Normal     Neuro/Psych  Headaches PSYCHIATRIC DISORDERS Anxiety Depression       GI/Hepatic negative GI ROS, Neg liver ROS,,,  Endo/Other  negative endocrine ROS    Renal/GU negative Renal ROS     Musculoskeletal   Abdominal   Peds  Hematology  (+) Blood dyscrasia, anemia Lab Results      Component                Value               Date                      WBC                      12.6 (H)            08/02/2023                HGB                      11.7 (L)            08/02/2023                HCT                      35.9 (L)            08/02/2023                MCV                      84.1                08/02/2023                PLT                      369                 08/02/2023              Anesthesia Other Findings   Reproductive/Obstetrics (+) Pregnancy                              Anesthesia Physical Anesthesia Plan  ASA: 2  Anesthesia Plan: Epidural   Post-op Pain Management:    Induction:   PONV Risk Score and Plan:   Airway Management Planned: Natural Airway  Additional Equipment:   Intra-op Plan:   Post-operative Plan:   Informed Consent: I have reviewed the patients History and Physical, chart, labs and discussed the procedure including the risks, benefits and alternatives for the proposed anesthesia with the patient or authorized representative who has  indicated his/her understanding and acceptance.       Plan Discussed with: Anesthesiologist  Anesthesia Plan Comments: (I  have discussed risks of neuraxial anesthesia including but not limited to infection, bleeding, nerve injury, back pain, headache, seizures, and failure of block. Patient denies bleeding disorders and is not currently anticoagulated. Labs have been reviewed. Risks and benefits discussed. All patient's questions answered.  )         Anesthesia Quick Evaluation

## 2023-08-02 NOTE — H&P (Signed)
OB ADMISSION/ HISTORY & PHYSICAL:  Admission Date: 08/02/2023 11:49 AM  Admit Diagnosis: Pregnancy [Z34.90]    Alison Hill is a 38 y.o. female G2P0010 at [redacted]w[redacted]d presenting for IOL gestational hypertension. Seen in the office today and ruled in for gestational hypertension and was sent for direct admission. Denies PEC symptoms. Denies contractions, leaking of fluid, or vaginal bleeding. Endorses + fetal movement. Sister, Kaleen Odea, present and supportive. Eagerly anticipating a surprise baby!  Prenatal History: G2P0010   EDC: 08/13/2023 Prenatal care at [facility] since 20 weeks, transfer from [facility] for CNM care  Primary: A. Yetta Barre, CNM  Prenatal course complicated by: Gestational hypertension, diagnosed today, mild range BP with normal PEC labs last week, close F/U with normal BP, mild range BP in the office today Hyperemesis, multiple meds Depression, on Prozac Rubella non-immune, varicella non-immune AMA Anemia, s/p 2 doses of IV Venofer  Prenatal Labs: ABO, Rh:   O POS Antibody: PENDING (02/11 1220) Rubella:   Non-immune RPR:   Non-reactive HBsAg:   Negative HIV:   Negative GBS:   Negative 1 hr Glucola : 133 Genetic Screening: Low risk Materni21 Ultrasound: normal anatomy, anterior placenta    Maternal Diabetes: No Genetic Screening: Normal Maternal Ultrasounds/Referrals: Normal Fetal Ultrasounds or other Referrals:  None Maternal Substance Abuse:  No Significant Maternal Medications:  Meds include: Prozac Protonix Significant Maternal Lab Results:  Group B Strep negative Other Comments:  None  Medical / Surgical History : Past medical history:  Past Medical History:  Diagnosis Date   Anemia    Anxiety    Depression    Headache    Ovarian cyst    UTI (urinary tract infection)    Vaginal Pap smear, abnormal    leep/biopsy;ok since    Past surgical history:  Past Surgical History:  Procedure Laterality Date   DILATION AND CURETTAGE OF  UTERUS     WISDOM TOOTH EXTRACTION Bilateral    all 4    Family History:  Family History  Problem Relation Age of Onset   Varicose Veins Mother    Hypertension Mother    Obesity Mother    Diabetes Mother    Heart disease Mother    Drug abuse Mother        recovering   Depression Mother    Anxiety disorder Mother    Mental illness Mother    Hypertension Father    Alcohol abuse Father    Depression Father     Social History:  reports that she has quit smoking. Her smoking use included cigarettes. She has never used smokeless tobacco. She reports that she does not currently use alcohol. She reports that she does not use drugs.  Allergies: Pear and Latex   Current Medications at time of admission:  Medications Prior to Admission  Medication Sig Dispense Refill Last Dose/Taking   Folic Acid-Vit B6-Vit B12 (FOLBEE) 2.5-25-1 MG TABS tablet Take 1 tablet by mouth 2 (two) times daily.      metoCLOPramide (REGLAN) 10 MG tablet Take 10 mg by mouth 4 (four) times daily.      Prenatal Vit-Fe Fumarate-FA (MULTIVITAMIN-PRENATAL) 27-0.8 MG TABS tablet Take 1 tablet by mouth daily at 12 noon.      scopolamine (TRANSDERM-SCOP) 1 MG/3DAYS Place 2 patches (3 mg total) onto the skin every 3 (three) days. 10 patch 12     Review of Systems: Review of Systems  Eyes:  Negative for blurred vision.  Gastrointestinal:  Negative for heartburn.  Neurological:  Negative for headaches.  Psychiatric/Behavioral:  The patient is nervous/anxious.   All other systems reviewed and are negative.  Physical Exam: Vital signs and nursing notes reviewed.  Patient Vitals for the past 24 hrs:  BP Temp Temp src Pulse Resp Height Weight  08/02/23 1300 -- -- -- -- 16 -- --  08/02/23 1218 -- -- -- -- -- 5\' 3"  (1.6 m) 89.4 kg  08/02/23 1205 (!) 135/93 98.1 F (36.7 C) Oral (!) 127 18 -- --    General: AAO x 3, NAD Heart: RRR Lungs:CTAB Abdomen: Gravid, NT Extremities: 1+ non-pitting edema SVE: Dilation:  1 Effacement (%): 50 Presentation: Vertex Exam by:: Dorisann Frames, cnm   Foley balloon placed without difficulty. 60cc of fluid instilled in balloon and taped to leg for traction. Patient tolerated procedure well.   FHR: 135BPM, moderate variability, + accels, no decels TOCO: Contractions occasional  Labs:   Recent Labs    08/02/23 1217  WBC 8.0  HGB 12.4  HCT 37.4  PLT 411*   Assessment/Plan: 37 y.o. G2P0010 at [redacted]w[redacted]d. gHTN, PEC labs pending Depression, on Prozac AMA  Fetal wellbeing - FHT category 1 EFW AGA 7-8lbs  Labor: Foley balloon placed with IV Fentanyl for anxiety, plan buccal Cytotec q 4 hours until Foley balloon is expelled, AROM, Pitocin prn  GBS negative Rubella non-immune, offer MMR postpartum Rh positive  Pain control: desires unmedicated birth, open to epidural Analgesia/anesthesia PRN  Anticipated MOD: NSVB  Plans to breastfeed. POC discussed with patient and support team, all questions answered.  Dr. Wendall Stade notified of admission/plan of care.  June Leap CNM, MSN 08/02/2023, 1:17 PM

## 2023-08-03 ENCOUNTER — Other Ambulatory Visit: Payer: Self-pay

## 2023-08-03 ENCOUNTER — Encounter (HOSPITAL_COMMUNITY)
Admission: RE | Disposition: A | Discharge status: Home / Self Care | Payer: Self-pay | Source: Home / Self Care | Attending: Obstetrics & Gynecology

## 2023-08-03 ENCOUNTER — Encounter (HOSPITAL_COMMUNITY): Payer: Self-pay | Admitting: Obstetrics & Gynecology

## 2023-08-03 DIAGNOSIS — O139 Gestational [pregnancy-induced] hypertension without significant proteinuria, unspecified trimester: Secondary | ICD-10-CM | POA: Diagnosis present

## 2023-08-03 DIAGNOSIS — F32A Depression, unspecified: Secondary | ICD-10-CM | POA: Diagnosis present

## 2023-08-03 DIAGNOSIS — O34211 Maternal care for low transverse scar from previous cesarean delivery: Secondary | ICD-10-CM

## 2023-08-03 DIAGNOSIS — Z3A38 38 weeks gestation of pregnancy: Secondary | ICD-10-CM

## 2023-08-03 DIAGNOSIS — Z98891 History of uterine scar from previous surgery: Secondary | ICD-10-CM

## 2023-08-03 LAB — CBC
HCT: 32.4 % — ABNORMAL LOW (ref 36.0–46.0)
Hemoglobin: 10.5 g/dL — ABNORMAL LOW (ref 12.0–15.0)
MCH: 27.6 pg (ref 26.0–34.0)
MCHC: 32.4 g/dL (ref 30.0–36.0)
MCV: 85.3 fL (ref 80.0–100.0)
Platelets: 348 10*3/uL (ref 150–400)
RBC: 3.8 MIL/uL — ABNORMAL LOW (ref 3.87–5.11)
RDW: 22.1 % — ABNORMAL HIGH (ref 11.5–15.5)
WBC: 17.4 10*3/uL — ABNORMAL HIGH (ref 4.0–10.5)
nRBC: 0 % (ref 0.0–0.2)

## 2023-08-03 SURGERY — Surgical Case
Anesthesia: Epidural | Site: Abdomen

## 2023-08-03 MED ORDER — OXYCODONE HCL 5 MG PO TABS
5.0000 mg | ORAL_TABLET | ORAL | Status: DC | PRN
  Administered 2023-08-04 – 2023-08-06 (×4): 10 mg via ORAL
  Administered 2023-08-06 – 2023-08-07 (×2): 5 mg via ORAL
  Filled 2023-08-03 (×3): qty 2
  Filled 2023-08-03 (×2): qty 1
  Filled 2023-08-03: qty 2

## 2023-08-03 MED ORDER — ONDANSETRON HCL 4 MG/2ML IJ SOLN
INTRAMUSCULAR | Status: DC | PRN
  Administered 2023-08-03: 4 mg via INTRAVENOUS

## 2023-08-03 MED ORDER — SODIUM CHLORIDE 0.9 % IV SOLN: INTRAVENOUS | Status: DC | PRN

## 2023-08-03 MED ORDER — PHENYLEPHRINE HCL (PRESSORS) 10 MG/ML IV SOLN
INTRAVENOUS | Status: DC | PRN
  Administered 2023-08-03: 160 ug via INTRAVENOUS

## 2023-08-03 MED ORDER — ACETAMINOPHEN 500 MG PO TABS
1000.0000 mg | ORAL_TABLET | Freq: Four times a day (QID) | ORAL | Status: DC
  Administered 2023-08-03 – 2023-08-07 (×16): 1000 mg via ORAL
  Filled 2023-08-03 (×17): qty 2

## 2023-08-03 MED ORDER — WITCH HAZEL-GLYCERIN EX PADS: 1.0000 | MEDICATED_PAD | CUTANEOUS | Status: DC | PRN

## 2023-08-03 MED ORDER — SIMETHICONE 80 MG PO CHEW
80.0000 mg | CHEWABLE_TABLET | Freq: Three times a day (TID) | ORAL | Status: DC
  Administered 2023-08-03 – 2023-08-07 (×13): 80 mg via ORAL
  Filled 2023-08-03 (×13): qty 1

## 2023-08-03 MED ORDER — SIMETHICONE 80 MG PO CHEW
80.0000 mg | CHEWABLE_TABLET | ORAL | Status: DC | PRN
  Administered 2023-08-06: 80 mg via ORAL
  Filled 2023-08-03: qty 1

## 2023-08-03 MED ORDER — IBUPROFEN 600 MG PO TABS
600.0000 mg | ORAL_TABLET | Freq: Four times a day (QID) | ORAL | Status: DC
  Administered 2023-08-03 – 2023-08-07 (×14): 600 mg via ORAL
  Filled 2023-08-03 (×15): qty 1

## 2023-08-03 MED ORDER — SENNOSIDES-DOCUSATE SODIUM 8.6-50 MG PO TABS
2.0000 | ORAL_TABLET | Freq: Every day | ORAL | Status: DC
  Administered 2023-08-04 – 2023-08-07 (×4): 2 via ORAL
  Filled 2023-08-03 (×4): qty 2

## 2023-08-03 MED ORDER — STERILE WATER FOR IRRIGATION IR SOLN
Status: DC | PRN
  Administered 2023-08-03: 1000 mL

## 2023-08-03 MED ORDER — LACTATED RINGERS IV SOLN
INTRAVENOUS | Status: DC | PRN
Start: 2023-08-03 — End: 2023-08-03

## 2023-08-03 MED ORDER — IBUPROFEN 600 MG PO TABS
600.0000 mg | ORAL_TABLET | Freq: Four times a day (QID) | ORAL | Status: DC
  Administered 2023-08-03: 600 mg via ORAL
  Filled 2023-08-03: qty 1

## 2023-08-03 MED ORDER — OXYTOCIN-SODIUM CHLORIDE 30-0.9 UT/500ML-% IV SOLN
INTRAVENOUS | Status: DC | PRN
  Administered 2023-08-03: 30 [IU] via INTRAVENOUS

## 2023-08-03 MED ORDER — DIBUCAINE (PERIANAL) 1 % EX OINT: 1.0000 | TOPICAL_OINTMENT | CUTANEOUS | Status: DC | PRN

## 2023-08-03 MED ORDER — ENOXAPARIN SODIUM 40 MG/0.4ML IJ SOSY
40.0000 mg | PREFILLED_SYRINGE | INTRAMUSCULAR | Status: DC
  Administered 2023-08-04 – 2023-08-06 (×3): 40 mg via SUBCUTANEOUS
  Filled 2023-08-03 (×3): qty 0.4

## 2023-08-03 MED ORDER — KETOROLAC TROMETHAMINE 30 MG/ML IJ SOLN
30.0000 mg | Freq: Once | INTRAMUSCULAR | Status: AC | PRN
  Administered 2023-08-03: 30 mg via INTRAVENOUS

## 2023-08-03 MED ORDER — MENTHOL 3 MG MT LOZG: 1.0000 | LOZENGE | OROMUCOSAL | Status: DC | PRN

## 2023-08-03 MED ORDER — FENTANYL CITRATE (PF) 100 MCG/2ML IJ SOLN
INTRAMUSCULAR | Status: AC
  Filled 2023-08-03: qty 2

## 2023-08-03 MED ORDER — PRENATAL MULTIVITAMIN CH
1.0000 | ORAL_TABLET | Freq: Every day | ORAL | Status: DC
  Administered 2023-08-03 – 2023-08-07 (×5): 1 via ORAL
  Filled 2023-08-03 (×5): qty 1

## 2023-08-03 MED ORDER — KETOROLAC TROMETHAMINE 30 MG/ML IJ SOLN: 30.0000 mg | Freq: Four times a day (QID) | INTRAMUSCULAR | Status: DC

## 2023-08-03 MED ORDER — MORPHINE SULFATE (PF) 0.5 MG/ML IJ SOLN
INTRAMUSCULAR | Status: DC | PRN
Start: 2023-08-03 — End: 2023-08-03
  Administered 2023-08-03: 3 mg via EPIDURAL

## 2023-08-03 MED ORDER — CEFAZOLIN SODIUM-DEXTROSE 2-3 GM-%(50ML) IV SOLR
INTRAVENOUS | Status: DC | PRN
  Administered 2023-08-03: 2 g via INTRAVENOUS

## 2023-08-03 MED ORDER — LIDOCAINE-EPINEPHRINE (PF) 2 %-1:200000 IJ SOLN
INTRAMUSCULAR | Status: DC | PRN
  Administered 2023-08-03: 2 mL via EPIDURAL
  Administered 2023-08-03: 5 mL via EPIDURAL

## 2023-08-03 MED ORDER — DIPHENHYDRAMINE HCL 25 MG PO CAPS
25.0000 mg | ORAL_CAPSULE | Freq: Four times a day (QID) | ORAL | Status: DC | PRN
  Administered 2023-08-03 – 2023-08-04 (×2): 25 mg via ORAL
  Filled 2023-08-03 (×2): qty 1

## 2023-08-03 MED ORDER — TERBUTALINE SULFATE 1 MG/ML IJ SOLN: 0.2500 mg | Freq: Once | INTRAMUSCULAR | Status: DC

## 2023-08-03 MED ORDER — DEXAMETHASONE SODIUM PHOSPHATE 10 MG/ML IJ SOLN
INTRAMUSCULAR | Status: DC | PRN
  Administered 2023-08-03: 10 mg via INTRAVENOUS

## 2023-08-03 MED ORDER — ACETAMINOPHEN 10 MG/ML IV SOLN
INTRAVENOUS | Status: DC | PRN
  Administered 2023-08-03: 1000 mg via INTRAVENOUS

## 2023-08-03 MED ORDER — ACETAMINOPHEN 10 MG/ML IV SOLN: 1000.0000 mg | Freq: Once | INTRAVENOUS | Status: DC | PRN

## 2023-08-03 MED ORDER — PHENYLEPHRINE HCL-NACL 20-0.9 MG/250ML-% IV SOLN
INTRAVENOUS | Status: DC | PRN
  Administered 2023-08-03: 60 ug/min via INTRAVENOUS

## 2023-08-03 MED ORDER — SODIUM CHLORIDE 0.9 % IV SOLN
INTRAVENOUS | Status: DC | PRN
  Administered 2023-08-03: 500 mg via INTRAVENOUS

## 2023-08-03 MED ORDER — KETOROLAC TROMETHAMINE 30 MG/ML IJ SOLN
INTRAMUSCULAR | Status: AC
  Filled 2023-08-03: qty 1

## 2023-08-03 MED ORDER — OXYTOCIN-SODIUM CHLORIDE 30-0.9 UT/500ML-% IV SOLN: 2.5000 [IU]/h | INTRAVENOUS | Status: AC

## 2023-08-03 MED ORDER — MORPHINE SULFATE (PF) 0.5 MG/ML IJ SOLN
INTRAMUSCULAR | Status: AC
  Filled 2023-08-03: qty 10

## 2023-08-03 MED ORDER — PROMETHAZINE (PHENERGAN) 6.25MG IN NS 50ML IVPB: 6.2500 mg | INTRAVENOUS | Status: DC | PRN

## 2023-08-03 MED ORDER — SODIUM CHLORIDE 0.9 % IR SOLN
Status: DC | PRN
  Administered 2023-08-03: 1

## 2023-08-03 MED ORDER — COCONUT OIL OIL
1.0000 | TOPICAL_OIL | Status: DC | PRN
  Administered 2023-08-05 – 2023-08-06 (×2): 1 via TOPICAL

## 2023-08-03 MED ORDER — FENTANYL CITRATE (PF) 100 MCG/2ML IJ SOLN: 25.0000 ug | INTRAMUSCULAR | Status: DC | PRN

## 2023-08-03 SURGICAL SUPPLY — 28 items
BENZOIN TINCTURE PRP APPL 2/3 (GAUZE/BANDAGES/DRESSINGS) IMPLANT
CHLORAPREP W/TINT 26 (MISCELLANEOUS) ×2 IMPLANT
CLAMP UMBILICAL CORD (MISCELLANEOUS) ×1 IMPLANT
CLOTH BEACON ORANGE TIMEOUT ST (SAFETY) ×1 IMPLANT
DRAPE C SECTION CLR SCREEN (DRAPES) IMPLANT
DRSG OPSITE POSTOP 4X10 (GAUZE/BANDAGES/DRESSINGS) ×1 IMPLANT
ELECT REM PT RETURN 9FT ADLT (ELECTROSURGICAL) ×1 IMPLANT
ELECTRODE REM PT RTRN 9FT ADLT (ELECTROSURGICAL) ×1 IMPLANT
EXTRACTOR VACUUM KIWI (MISCELLANEOUS) IMPLANT
GAUZE SPONGE 4X4 12PLY STRL LF (GAUZE/BANDAGES/DRESSINGS) IMPLANT
GLOVE BIO SURGEON STRL SZ7 (GLOVE) ×1 IMPLANT
GLOVE BIOGEL PI IND STRL 7.0 (GLOVE) ×1 IMPLANT
GOWN STRL REUS W/TWL LRG LVL3 (GOWN DISPOSABLE) ×2 IMPLANT
KIT ABG SYR 3ML LUER SLIP (SYRINGE) IMPLANT
MAT PREVALON FULL STRYKER (MISCELLANEOUS) IMPLANT
NDL HYPO 25X5/8 SAFETYGLIDE (NEEDLE) IMPLANT
NEEDLE HYPO 25X5/8 SAFETYGLIDE (NEEDLE) IMPLANT
NS IRRIG 1000ML POUR BTL (IV SOLUTION) ×1 IMPLANT
PACK C SECTION WH (CUSTOM PROCEDURE TRAY) ×1 IMPLANT
PAD OB MATERNITY 4.3X12.25 (PERSONAL CARE ITEMS) ×1 IMPLANT
STRIP CLOSURE SKIN 1/2X4 (GAUZE/BANDAGES/DRESSINGS) IMPLANT
SUT PLAIN ABS 2-0 CT1 27XMFL (SUTURE) ×2 IMPLANT
SUT VIC AB 0 CT1 36 (SUTURE) ×2 IMPLANT
SUT VIC AB 0 CTX36XBRD ANBCTRL (SUTURE) ×2 IMPLANT
SUT VIC AB 4-0 KS 27 (SUTURE) ×1 IMPLANT
TOWEL OR 17X24 6PK STRL BLUE (TOWEL DISPOSABLE) ×1 IMPLANT
TRAY FOLEY W/BAG SLVR 14FR LF (SET/KITS/TRAYS/PACK) IMPLANT
WATER STERILE IRR 1000ML POUR (IV SOLUTION) ×1 IMPLANT

## 2023-08-03 NOTE — Progress Notes (Signed)
Patient in PACU 2 hours with no sensation to LLE, limited sensation to RLE; no mvmt to LLE, limited mvmt to right foot. Dr Isaias Cowman called to come to bedside to assess. Per Dr Isaias Cowman, continue to monitor-patient had a high level in the OR and it making progress. Lawson Radar

## 2023-08-03 NOTE — Progress Notes (Signed)
S: Comfortable with epidural. Family at the bedside.   O: Vitals:   08/03/23 0210 08/03/23 0215 08/03/23 0220 08/03/23 0230  BP:    (!) 138/94  Pulse:    (!) 147  Resp:      Temp:      TempSrc:      SpO2: 99% 100% 99% 100%  Weight:      Height:       FHT:  FHR: 160 bpm, variability: minimal ,  accelerations:  Absent,  decelerations:  Present lates and variables UC:   regular, every 2-4 minutes SVE:   Dilation: 6 Effacement (%): 70 Station: -3 Exam by:: Marylene Buerger  A / P: Induction of labor due to gestational hypertension, persistent Category II FHT, no cervical change  Fetal Wellbeing:  Category II Pain Control:  Epidural Anticipated MOD:   PLTCS  Discussed persistent Cat II FHT remote from delivery. Recommend proceeding with PLTCS for fetal intolerance to labor. Dr. Wendall Stade en route for consent.   June Leap, CNM, MSN 08/03/2023, 2:45 AM

## 2023-08-03 NOTE — Op Note (Signed)
Cesarean Section Operative Note  Indications: non-reassuring fetal status  Pre-operative Diagnosis: [redacted]w[redacted]d   Post-operative Diagnosis: same  Surgeon: Edger House, MD  Assistants: Dorisann Frames, CNM  Anesthesia: Epidural anesthesia  Findings: Female infant delivered at 0345 in OP position w/nuchal cord x1 weighing 3610g, APGARS 7/10. Normal appearing uterus, bilateral fallopian tubes and ovaries. Hysterotomy closed in single layer. Hemostatic at end of case.  Procedure Details   The patient was seen in the pre-operative area. Risks, benefits, and alternatives of the procedure were reviewed and informed consent was signed and witnessed. Patient proceeded to operating room where epidural anesthesia was obtained without complication. SCD boots were placed and turned on. Foley catheter was placed. IV Cefazolin 2g and 500mg  Azithromycin were administered. The patient was prepped and draped in usual sterile manner.   Alis clamps were used to ensure adequate anesthesia. A Pfannenstiel incision was made with the scalpel and carried down through the subcutaneous tissue to the fascia using the Bovie. Fascial incision was made and extended transversely using the mayo scissors. The fascia was separated from the underlying rectus tissue superiorly and inferiorly both bluntly and with sharp dissection. The peritoneum was identified and entered bluntly. Peritoneal incision was extended longitudinally. No intraperitoneal adhesions were encountered. An Alexis retractor was placed for excellent visualization.   A low transverse uterine incision was made. A female fetus was delivered in OP at 0345 weighing 3610g with Apgars 7/9 without complication. The cord clamping was delayed for 1 minute  and newborn handed to awaiting pediatrics team.   The uterus was exteriorized. The hysterotomy was closed using 0-vicryl in a single layer.  Normal appearing uterus, fallopian tubes, and bilateral ovaries visualized. The  uterus was replaced into the abdomen and hemostasis assured. Uterine tone was noted to be firm.. No uterotonics were administered. The Jon Gills was removed and gutters cleaned. The fascia was closed in a single running layer of 0-vicryl. The subcutaneous layer was irrigated and re-approximated with 2-0 plain. The skin was closed with 4-0 vicryl. A dressing of steri strips, honeycomb dressing was placed.   Instrument, sponge, and needle counts were correct prior the abdominal closure and at the conclusion of the case.   Estimated Blood Loss:  525cc         Drains: Foley catjeter         Total IV Fluids:  2000 ml         Specimens: Placenta          Implants: None         Complications:  None; patient tolerated the procedure well.         Disposition: PACU - hemodynamically stable.         Condition: stable  Edger House, MD

## 2023-08-03 NOTE — Lactation Note (Signed)
This note was copied from a baby's chart. Lactation Consultation Note  Patient Name: Alison Hill [id number] Date: 08/03/2023 Age:40 hours, P1  Reason for consult: Initial assessment;Primapara;1st time breastfeeding;Early term 37-38.6wks;Breastfeeding assistance Per mom baby was fed a bottle due to baby not eating after birth 20 ml at 6:30.  Per mom really want to breast feed. Baby wide awake and rooting. LC offered to assist and mom receptive .  1st checked the diaper and it was dry. ( 2 wets and HNS )  2nd showed mom hand expressing with 2-3 small drops , LC swabbed the baby's mouth with a gloved finger.  After several attempts and assisting baby latched for 10 mins with depth. No swallows and per mom comfortable.  Nipple well rounded when baby released.  LC reviewed 24 breast feeding goals - Fed with cues and by 3 hours offer the breast STS. ( 8-12 times a day )  After feeding baby STS with mom and family member  and grandmother present.   Maternal Data Has patient been taught Hand Expression?: Yes (2-3 small drops and swabbed the baby's mouth with a glove finger) Does the patient have breastfeeding experience prior to this delivery?: No  Feeding Mother's Current Feeding Choice: Breast Milk and Formula Nipple Type: Slow - flow  LATCH Score Latch: Repeated attempts needed to sustain latch, nipple held in mouth throughout feeding, stimulation needed to elicit sucking reflex.  Audible Swallowing: None  Type of Nipple: Everted at rest and after stimulation (areola compresses well after reverse pressure)  Comfort (Breast/Nipple): Soft / non-tender  Hold (Positioning): Full assist, staff holds infant at breast  LATCH Score: 5   Lactation Tools Discussed/Used  Not needed as of yet   Interventions Interventions: Breast feeding basics reviewed;Assisted with latch;Skin to skin;Breast massage;Hand express;Reverse pressure;Breast compression;Adjust position;Support  pillows;Education;LC Services brochure;CDC Guidelines for Breast Pump Cleaning  Discharge Pump: Personal;DEBP (per mom has ordered her DEBP from her insurance company)  Consult Status Consult Status: Follow-up Date: 08/03/23 Follow-up type: In-patient    Matilde Sprang Fynn Adel 08/03/2023, 9:19 AM

## 2023-08-03 NOTE — Lactation Note (Signed)
This note was copied from a baby's chart. Lactation Consultation Note  Patient Name: Alison Hill [id number] Date: 08/03/2023 Age:40 hours - 2nd Visit for [facility] - unit secretary called saying the nurse was busy.  Reason for consult: Follow-up assessment;Primapara;1st time breastfeeding;Early term 37-38.6wks;Breastfeeding assistance As LC arrived baby was shallowly latched on the right breast side lying. LC assisted to increased depth and baby released.  Large wet and Large stool diaper changed.  Baby re-latched on the left breast , football with depth and swallows. Still feeding and per mom comfortable.   Maternal Data Has patient been taught Hand Expression?: Yes  Feeding Mother's Current Feeding Choice: Breast Milk and Formula  LATCH Score Latch: Repeated attempts needed to sustain latch, nipple held in mouth throughout feeding, stimulation needed to elicit sucking reflex.  Audible Swallowing: Spontaneous and intermittent  Type of Nipple: Everted at rest and after stimulation  Comfort (Breast/Nipple): Soft / non-tender  Hold (Positioning): Assistance needed to correctly position infant at breast and maintain latch.  LATCH Score: 8   Lactation Tools Discussed/Used  Not needed as of yet   Interventions Interventions: Breast feeding basics reviewed;Assisted with latch;Skin to skin;Breast massage;Hand express;Reverse pressure;Breast compression;Adjust position;Support pillows;Position options;Education  Discharge Pump: Personal;DEBP  Consult Status Consult Status: Follow-up Date: 08/04/23 Follow-up type: In-patient    Alison Hill Rachit Grim 08/03/2023, 2:33 PM

## 2023-08-03 NOTE — Anesthesia Postprocedure Evaluation (Signed)
Anesthesia Post Note  Patient: Alison Hill  Procedure(s) Performed: CESAREAN SECTION (Abdomen)     Patient location during evaluation: PACU Anesthesia Type: Epidural Level of consciousness: awake Pain management: pain level controlled Vital Signs Assessment: post-procedure vital signs reviewed and stable Respiratory status: spontaneous breathing, nonlabored ventilation and respiratory function stable Cardiovascular status: stable Postop Assessment: no headache, no backache and epidural receding Anesthetic complications: no   No notable events documented.  Last Vitals:  Vitals:   08/03/23 0715 08/03/23 0744  BP: 128/89 123/86  Pulse: (!) 124 (!) 118  Resp: 19 20  Temp:  36.6 C  SpO2: 96% 100%    Last Pain:  Vitals:   08/03/23 0744  TempSrc: Oral  PainSc: 0-No pain                 Linton Rump

## 2023-08-03 NOTE — Progress Notes (Signed)
Labor Progress Note  S: Patient reports feeling comfortable with epidural.   O:  Vitals:   08/03/23 0220 08/03/23 0230  BP:  (!) 138/94  Pulse:  (!) 147  Resp:    Temp:    SpO2: 99% 100%    SVE: 6/70/-3  EFM: baseline 160 bpm, moderate variability, + accels, + intermittent late decels Toco: ctxs q2-37min  A/P: 38 y.o. G2P0010 at [redacted]w[redacted]d with gHTN admitted for IOL for gHTN. Patient with intermittent Cat II tracing, now with intermittent late decels and periods of minimal variability. Reviewed with primary provider, CNM Jones, and recommend delivery via C/S. Discussed plan with patient and family at bedside. Risks and benefits reviewed and informed consent obtained. Will proceed to OR when ready.  Marlene Bast, MD

## 2023-08-03 NOTE — Transfer of Care (Signed)
Immediate Anesthesia Transfer of Care Note  Patient: Georga Bora  Procedure(s) Performed: CESAREAN SECTION (Abdomen)  Patient Location: PACU  Anesthesia Type:Epidural  Level of Consciousness: awake, alert , and oriented  Airway & Oxygen Therapy: Patient Spontanous Breathing  Post-op Assessment: Report given to RN and Post -op Vital signs reviewed and stable  Post vital signs: Reviewed and stable  Last Vitals:  Vitals Value Taken Time  BP 100/91 08/03/23 0433  Temp    Pulse 125 08/03/23 0442  Resp 23 08/03/23 0442  SpO2 100 % 08/03/23 0442  Vitals shown include unfiled device data.  Last Pain:  Vitals:   08/03/23 0300  TempSrc:   PainSc: 0-No pain         Complications: No notable events documented.

## 2023-08-04 NOTE — Social Work (Signed)
MOB was referred for history of depression/anxiety.  * Referral screened out by Clinical Social Worker because none of the following criteria appear to apply:  ~ History of anxiety/depression during this pregnancy, or of post-partum depression following prior delivery.  ~ Diagnosis of anxiety and/or depression within last 3 years OR * MOB's symptoms currently being treated with medication and/or therapy. Per chart review MOB is currently taking Prozac. Edinburgh =4.  Please contact the Clinical Social Worker if needs arise, or by MOB request.   Wende Neighbors, LCSWA Clinical Social Worker [phone]

## 2023-08-04 NOTE — Progress Notes (Signed)
No c/o, tol po, ambulating, +flatus Pain controlled, voids w/o difficulty Breastfeeding  Patient Vitals for the past 24 hrs:  BP Temp Temp src Pulse Resp SpO2  08/04/23 0529 116/64 98.4 F (36.9 C) Oral (!) 109 18 --  08/04/23 0021 119/74 98.5 F (36.9 C) Oral 99 19 --  08/03/23 1900 -- 98.5 F (36.9 C) Oral -- -- --  08/03/23 1530 127/79 98 F (36.7 C) Oral (!) 106 16 --  08/03/23 1145 130/80 98.2 F (36.8 C) Oral 95 16 --  08/03/23 1045 126/78 98 F (36.7 C) Oral (!) 101 16 100 %  08/03/23 0915 137/78 98 F (36.7 C) Oral (!) 104 20 100 %   Intake/Output Summary (Last 24 hours) at 08/04/2023 0914 Last data filed at 08/04/2023 0200 Gross per 24 hour  Intake 57.2 ml  Output 3400 ml  Net -3342.8 ml     A&ox3 Rrr Ctab Abd: soft,nt,nd; fundus firm and below umb, dressing c/d/I LE: no edema,nt bilat     Latest Ref Rng & Units 08/03/2023    8:36 AM 08/02/2023    8:59 PM 08/02/2023   12:17 PM  CBC  WBC 4.0 - 10.5 K/uL 17.4  12.6  8.0   Hemoglobin 12.0 - 15.0 g/dL 16.1  09.6  04.5   Hematocrit 36.0 - 46.0 % 32.4  35.9  37.4   Platelets 150 - 400 K/uL 348  369  411    A/P: pod 1 s/p 1ltcs for nrfht Doing well - cont care Anemia of pregnancy with acute change d/t abl - asymptomatic, not significant, plan iron daily Rubella NI - plan mmr before d/c RH pos

## 2023-08-05 NOTE — Lactation Note (Addendum)
This note was copied from a baby's chart. Lactation Consultation Note  Patient Name: Alison Hill [id number] Date: 08/05/2023 Age:40 hours Reason for consult: Follow-up assessment;1st time breastfeeding;Early term 37-38.6wks;Mother's request;Infant weight loss  P1,  Baby [redacted]w[redacted]d. 9.69% weight loss.  3 voids/4 stools in the last 24 hours.  Mother has agreed to supplement with donor milk and RN had her sign consent.  Reviewed hand expression with good flow of transitional breastmilk.  Assisted with latching in both cross cradle and football holds. Baby took a few attempts to grasp nipple with good depth and sustain latch.  Baby breastfed for approx. 25 min in football hold. Baby was then supplemented with 28 ml of donor milk.  Reviewed paced feeding. Mother pumped for 15 minutes with drops expressed on nipple.  Plan: Breastfeed on demand 8-12 times per day. Supplement with donor milk as baby desires.  Volume guidelines reviewed. Post pump every other feeding for 15 min. Give volume pumped back to baby with the difference with donor milk. Call for help as needed.   Maternal Data Has patient been taught Hand Expression?: Yes Does the patient have breastfeeding experience prior to this delivery?: No  Feeding Mother's Current Feeding Choice: Breast Milk and Donor Milk Lactation Tools Discussed/Used Tools: Pump;Flanges Flange Size: 18 Breast pump type: Double-Electric Breast Pump Pump Education: Setup, frequency, and cleaning;Milk Storage Reason for Pumping: stimulation and supplementation Pumping frequency: q 3 hours for 15 min  Interventions Interventions: Education;DEBP;Breast feeding basics reviewed;Assisted with latch;Hand express  Discharge Pump: Personal;Hands Free (Baby Buddha has not arrived)  Consult Status Consult Status: Follow-up Date: 08/06/23 Follow-up type: In-patient    Hardie Pulley  RN, IBCLC 08/05/2023, 9:48 AM

## 2023-08-05 NOTE — Progress Notes (Signed)
Subjective: Postpartum Day Two: Cesarean Delivery Patient reports increased pain with ambulation yesterday, working on pain control and mobility. Denies any dizziness with ambulation. VB has been lighter. Spontaneously voiding without difficulties. Passing gas. Tolerating a regular diet. Notes some increased swelling on the tops of her feet. No CP, SOB, or HA.   Baby girl is doing well at bedside, working on BF    Objective: Patient Vitals for the past 24 hrs:  BP Temp Temp src Pulse Resp SpO2  08/05/23 0501 106/71 98.2 F (36.8 C) Oral 91 18 99 %  08/04/23 2159 118/82 97.7 F (36.5 C) Axillary 99 18 100 %  08/04/23 1357 117/67 98.5 F (36.9 C) Oral 98 18 100 %   No intake or output data in the 24 hours ending 08/05/23 0907   Physical Exam:  General: alert and no distress Lochia: appropriate Uterine Fundus: firm Incision: healing well, no significant drainage, no dehiscence, no significant erythema DVT Evaluation: No evidence of DVT seen on physical exam.  Recent Labs    08/02/23 2059 08/03/23 0836  HGB 11.7* 10.5*  HCT 35.9* 32.4*    Assessment/Plan: Status post Cesarean section. Doing well postoperatively.  Continue current care. Georga Bora [id number] POD#2 sp primary cesarean section at [redacted]w[redacted]d for NRFHT 1. PPC: continue current PO pain regimen, encourage ambulation; reviewed postop pain management expectations 2. IOL for gHTN: normal PP BP's and s/p normal PIH labs on admission, no medications indicated, continue to  monitor 3. Acute blood loss anemia, not clinically significant. Asymptomatic and PP lochia appropriate 4. RH POS 5. LC support PRN  Continue inpatient care for optimal pain control and anticipate DC home tomorrow on POD3  Trent Gabler A Shiasia Porro 08/05/2023, 9:05 AM

## 2023-08-06 ENCOUNTER — Inpatient Hospital Stay (HOSPITAL_COMMUNITY): Payer: 59

## 2023-08-06 LAB — CBC
HCT: 30.5 % — ABNORMAL LOW (ref 36.0–46.0)
Hemoglobin: 9.9 g/dL — ABNORMAL LOW (ref 12.0–15.0)
MCH: 27.4 pg (ref 26.0–34.0)
MCHC: 32.5 g/dL (ref 30.0–36.0)
MCV: 84.5 fL (ref 80.0–100.0)
Platelets: 458 10*3/uL — ABNORMAL HIGH (ref 150–400)
RBC: 3.61 MIL/uL — ABNORMAL LOW (ref 3.87–5.11)
RDW: 22.5 % — ABNORMAL HIGH (ref 11.5–15.5)
WBC: 7.9 10*3/uL (ref 4.0–10.5)
nRBC: 0.3 % — ABNORMAL HIGH (ref 0.0–0.2)

## 2023-08-06 LAB — COMPREHENSIVE METABOLIC PANEL
ALT: 15 U/L (ref 0–44)
AST: 36 U/L (ref 15–41)
Albumin: 2.5 g/dL — ABNORMAL LOW (ref 3.5–5.0)
Alkaline Phosphatase: 77 U/L (ref 38–126)
Anion gap: 9 (ref 5–15)
BUN: 8 mg/dL (ref 6–20)
CO2: 23 mmol/L (ref 22–32)
Calcium: 9.2 mg/dL (ref 8.9–10.3)
Chloride: 103 mmol/L (ref 98–111)
Creatinine, Ser: 0.74 mg/dL (ref 0.44–1.00)
GFR, Estimated: >60 mL/min (ref >60)
Glucose, Bld: 99 mg/dL (ref 70–99)
Potassium: 4.1 mmol/L (ref 3.5–5.1)
Sodium: 135 mmol/L (ref 135–145)
Total Bilirubin: 0.4 mg/dL (ref 0.0–1.2)
Total Protein: 5.3 g/dL — ABNORMAL LOW (ref 6.5–8.1)

## 2023-08-06 MED ORDER — NIFEDIPINE ER OSMOTIC RELEASE 30 MG PO TB24
30.0000 mg | ORAL_TABLET | Freq: Two times a day (BID) | ORAL | Status: DC
  Administered 2023-08-06 – 2023-08-07 (×2): 30 mg via ORAL
  Filled 2023-08-06 (×2): qty 1

## 2023-08-06 MED ORDER — FUROSEMIDE 20 MG PO TABS
20.0000 mg | ORAL_TABLET | Freq: Every day | ORAL | Status: DC
  Administered 2023-08-06 – 2023-08-07 (×2): 20 mg via ORAL
  Filled 2023-08-06 (×2): qty 1

## 2023-08-06 MED ORDER — NIFEDIPINE ER OSMOTIC RELEASE 30 MG PO TB24
30.0000 mg | ORAL_TABLET | Freq: Every day | ORAL | Status: DC
  Administered 2023-08-06: 30 mg via ORAL
  Filled 2023-08-06: qty 1

## 2023-08-06 NOTE — Lactation Note (Addendum)
This note was copied from a baby's chart. Lactation Consultation Note  Patient Name: Alison Hill Date: 08/06/2023 Age:40 hours Reason for consult: Follow-up assessment;Infant weight loss;Early term 37-38.6wks;1st time breastfeeding  P1, 10.5% weight loss.  Less than 1% weight loss in the last 24 hours.  Weight stabilizing.  Voids/stools WNL. Baby was latched when [facility] entered room.  Demonstrated how to give baby breastmilk via 5 french feeding tube while latched.  Baby has strong suck and quickly pulled in 4 ml of donor milk. Probably too cumbersome for every feeding but if baby is fussy at the breast, it is an option.  Took tube out and baby continued to latch for a 25 min feeding and was content afterwards.   Mother has not pumped since last night.  Recommend pumping q 3 hours and give volume back to baby. Reviewed engorgement care and monitoring voids/stools.  Maternal Data Has patient been taught Hand Expression?: Yes  Feeding Mother's Current Feeding Choice: Breast Milk and Donor Milk  Interventions Interventions: DEBP;Education;Hand pump;Assisted with latch  Discharge Discharge Education: Engorgement and breast care;Warning signs for feeding baby;Outpatient recommendation  Consult Status Consult Status: Complete Date: 08/06/23    Dahlia Byes Boschen  RN IBCLC 08/06/2023, 10:49 AM

## 2023-08-06 NOTE — Progress Notes (Signed)
BPs not down. Will change to Procardia 30mg  XL BID

## 2023-08-06 NOTE — Progress Notes (Signed)
Subjective: PPD #3, Primary Cesarean Delivery Feels well but says her abdo muscles have charlie horse feeling, says tolerating a regular diet and passing flatus. Notes some increased swelling on the tops of her feet. No CP, SOB, or HA.   Baby girl is doing well at bedside, working on BF    Objective: Patient Vitals for the past 24 hrs:  BP Temp Temp src Pulse Resp SpO2  08/06/23 1317 (!) 142/98 98.5 F (36.9 C) Oral 94 18 96 %  08/06/23 1011 (!) 157/99 -- -- 97 -- --  08/06/23 0657 (!) 139/95 -- -- 95 -- --  08/06/23 0607 133/89 98 F (36.7 C) Oral 97 17 100 %  08/05/23 2014 120/78 98.2 F (36.8 C) Oral 85 18 100 %     Physical Exam:  General: alert and no distress Lochia: appropriate Uterine Fundus: firm Abdo gaseous distention ++  NABS, non acute  Incision: healing well, no significant drainage, no dehiscence, no significant erythema DVT Evaluation: No evidence of DVT seen on physical exam.     Latest Ref Rng & Units 08/06/2023   11:10 AM 08/03/2023    8:36 AM 08/02/2023    8:59 PM  CBC  WBC 4.0 - 10.5 K/uL 7.9  17.4  12.6   Hemoglobin 12.0 - 15.0 g/dL 9.9  16.1  09.6   Hematocrit 36.0 - 46.0 % 30.5  32.4  35.9   Platelets 150 - 400 K/uL 458  348  369        Latest Ref Rng & Units 08/06/2023   11:10 AM 08/02/2023    1:56 PM 03/03/2023    6:42 PM  CMP  Glucose 70 - 99 mg/dL 99  045  89   BUN 6 - 20 mg/dL 8  6  5    Creatinine 0.44 - 1.00 mg/dL 4.09  8.11  9.14   Sodium 135 - 145 mmol/L 135  133  133   Potassium 3.5 - 5.1 mmol/L 4.1  3.6  3.8   Chloride 98 - 111 mmol/L 103  107  101   CO2 22 - 32 mmol/L 23  16  19    Calcium 8.9 - 10.3 mg/dL 9.2  9.4  9.7   Total Protein 6.5 - 8.1 g/dL 5.3  6.3  6.4   Total Bilirubin 0.0 - 1.2 mg/dL 0.4  0.4  0.4   Alkaline Phos 38 - 126 U/L 77  111  40   AST 15 - 41 U/L 36  25  19   ALT 0 - 44 U/L 15  10  18        Assessment/Plan: 38 yo G2P1011 POD# 3 sp primary cesarean section at [redacted]w[redacted]d for NRFHT IOL for [facility],, now BP increased.  Cannot discharge today due to BPs getting worse in HTN range. Procardia 30mg  XL was started this morning/ CBC, CMP note nl platelets and nl LFTs, creatinine.  Add Lasix for LE swelling 20mg  daily for 5 days  Work in flatus with warm tea, ambulate, po gassex.  Acute blood loss anemia, not clinically significant. Asymptomatic and PP lochia appropriate RH POS LC support PRN   Will assess BPs/. Titrate meds, hoping for D/c tomorrow   Robley Fries 08/06/2023, 12:11 PM

## 2023-08-06 NOTE — Lactation Note (Addendum)
This note was copied from a baby's chart. Lactation Consultation Note  Patient Name: Girl Kadedra Vanaken BMWUX'L Date: 08/06/2023 Age:40 days, Change in weight -9.69 to -10.53%, MOB P1, C/S delivery, See MOB : MR- hx anemia, AMA.  MOB finished  breastfeeding infant for 40 minutes,prior to [facility] entering the room, LC suggested due infant's weight loss to supplement infant on Day 4 ( 28-42 mls of EBM/ Donor milk) per feeding, until MOB milk supply is establish and infant regains her birth weight. Infant was given 28 mls of Donor milk using White Nfant Nipple and appeared content after the feeding. LC reviewed how to use DEBP with 18 mm breast flanges, MOB was expressing few drops of colostrum in flanges while LC was in the room.  MOB knows that her EBM is safe for 4 hours at room temperature whereas donor breast milk once warmed must be discarded within 1 hour.   Current feeding plan: 1- MOB will continue to latch infant for 25 minutes at the breast, afterwards infant will be supplemented  by FOB with any EBM first and then donor milk ( 28-42 mls) per feeding using slow flow White Nfant nipple while MOB uses the DEBP. 2- MOB knows to call for latch assistance or if she has any questions or concerns regarding breastfeeding. 3- LC reinforced watching early hunger cues, if infant is fussy prior to latch offer few mls of donor milk and then latch infant to breast, continue feeding infant by cues, every 2-3 hours, skin to skin. 4-LC reinforced importance of maternal rest.    Maternal Data    Feeding    LATCH Score                    Lactation Tools Discussed/Used    Interventions    Discharge    Consult Status      Frederico Hamman 08/06/2023, 4:28 PM

## 2023-08-07 MED ORDER — OXYCODONE HCL 5 MG PO TABS: 5.0000 mg | ORAL_TABLET | Freq: Four times a day (QID) | ORAL | 0 refills | Status: AC | PRN

## 2023-08-07 MED ORDER — MEASLES, MUMPS & RUBELLA VAC IJ SOLR
0.5000 mL | Freq: Once | INTRAMUSCULAR | Status: AC
  Administered 2023-08-07: 0.5 mL via SUBCUTANEOUS
  Filled 2023-08-07: qty 0.5

## 2023-08-07 MED ORDER — FUROSEMIDE 20 MG PO TABS: 20.0000 mg | ORAL_TABLET | Freq: Every day | ORAL | 0 refills | Status: AC

## 2023-08-07 MED ORDER — IBUPROFEN 600 MG PO TABS: 600.0000 mg | ORAL_TABLET | Freq: Four times a day (QID) | ORAL | 0 refills | Status: AC

## 2023-08-07 MED ORDER — ACETAMINOPHEN 500 MG PO TABS: 1000.0000 mg | ORAL_TABLET | Freq: Four times a day (QID) | ORAL | 0 refills | Status: AC

## 2023-08-07 MED ORDER — NIFEDIPINE ER 30 MG PO TB24: 30.0000 mg | ORAL_TABLET | Freq: Two times a day (BID) | ORAL | 0 refills | Status: AC

## 2023-08-07 NOTE — Progress Notes (Signed)
Subjective: PPD # 4 Primary Cesarean Delivery Extended stay for Postpartum HTN, needing better control.  Feels well but has HA. No vision changes or SOB/ CP. LE swelling is better w/ Lasix. Pain well controlled.   Baby girl is doing well at bedside, working on BF    Objective: Patient Vitals for the past 24 hrs:  BP Temp Temp src Pulse Resp SpO2  08/07/23 1009 135/88 -- -- (!) 103 -- --  08/07/23 0615 117/68 -- -- (!) 101 -- --  08/07/23 0513 (!) 141/88 98.2 F (36.8 C) Oral 100 16 100 %  08/06/23 2220 130/80 -- -- 98 -- --  08/06/23 2039 129/86 -- Oral (!) 111 18 100 %  08/06/23 1317 (!) 142/98 98.5 F (36.9 C) Oral 94 18 96 %    BP 135/88   Pulse (!) 103   Temp 98.2 F (36.8 C) (Oral)   Resp 16   Ht 5\' 3"  (1.6 m)   Wt 89.4 kg   SpO2 100%   Breastfeeding Unknown   BMI 34.90 kg/m    Physical Exam:  General: alert and no distress Lungs CTA ' CV RRR  Uterine Fundus: firm Abdo soft, non acute  Incision: healing well, no significant drainage, no dehiscence, no significant erythema DVT Evaluation: No evidence of DVT seen on physical exam. No edema today      Latest Ref Rng & Units 08/06/2023   11:10 AM 08/03/2023    8:36 AM 08/02/2023    8:59 PM  CBC  WBC 4.0 - 10.5 K/uL 7.9  17.4  12.6   Hemoglobin 12.0 - 15.0 g/dL 9.9  14.7  82.9   Hematocrit 36.0 - 46.0 % 30.5  32.4  35.9   Platelets 150 - 400 K/uL 458  348  369        Latest Ref Rng & Units 08/06/2023   11:10 AM 08/02/2023    1:56 PM 03/03/2023    6:42 PM  CMP  Glucose 70 - 99 mg/dL 99  562  89   BUN 6 - 20 mg/dL 8  6  5    Creatinine 0.44 - 1.00 mg/dL 1.30  8.65  7.84   Sodium 135 - 145 mmol/L 135  133  133   Potassium 3.5 - 5.1 mmol/L 4.1  3.6  3.8   Chloride 98 - 111 mmol/L 103  107  101   CO2 22 - 32 mmol/L 23  16  19    Calcium 8.9 - 10.3 mg/dL 9.2  9.4  9.7   Total Protein 6.5 - 8.1 g/dL 5.3  6.3  6.4   Total Bilirubin 0.0 - 1.2 mg/dL 0.4  0.4  0.4   Alkaline Phos 38 - 126 U/L 77  111  40   AST 15 -  41 U/L 36  25  19   ALT 0 - 44 U/L 15  10  18      O+ Rub Non imm Varicella Non imm   Assessment/Plan: 38 yo G2P1011 POD# 4 sp primary cesarean section at [redacted]w[redacted]d for NRFHT IOL for [facility],, now pp HTN, better w/ procardia 30mg  XL bid. Lasix 20mg  daily.  BP better, okay for D/c home today w/ f/up in office in 3 days  Warning s/s dw pt   Acute blood loss anemia, not clinically significant.   Discharge home and f/up 3 days, pp care and PP HTN warning ss, call parameters and post op care dw pt   Robley Fries 08/07/2023, 12:18 PM

## 2023-08-07 NOTE — Lactation Note (Signed)
This note was copied from a baby's chart. Lactation Consultation Note  Patient Name: [name] WGNFA'O Date: 08/07/2023 Age:40 days, P1 , baby gained 10 gms from yesterday.  Reason for consult: Follow-up assessment;Infant weight loss;Primapara;1st time breastfeeding;Breastfeeding assistance (10 % weight loss -) As LC entered the room mom resting in bed and baby beside her.  Baby started to fuss and LC offered to check the diaper. Large wet changed. Baby rooting and LC placed baby on the left breast, baby latched well with depth and LC assisted to support the baby's hips and noted more swallows. Baby fed 5 mins and fell asleep. It had only been 2 hours since she fed and supplemented with formula.  LC reviewed Breast feeding D/C teaching and LC resources including engorgement prevention and tx , and explored options to enhance the milk coming in.  Option recommended - Feed the baby 15 - 20 mins on the 1st breast if in a good swallowing pattern, let her finish and supplement 30 ml . Post pump both breast 15 mins . Once milk comes in well give the baby more time at the breast.  LC offered to request and LC O/P and mom receptive . Mom is aware she will receive a call back from [facility] O/P.    Maternal Data Has patient been taught Hand Expression?: Yes  Feeding Mother's Current Feeding Choice: Breast Milk and Formula Nipple Type: Nfant Standard Flow (white)  LATCH Score Latch: Grasps breast easily, tongue down, lips flanged, rhythmical sucking.  Audible Swallowing: A few with stimulation  Type of Nipple: Everted at rest and after stimulation  Comfort (Breast/Nipple): Soft / non-tender  Hold (Positioning): Assistance needed to correctly position infant at breast and maintain latch.  LATCH Score: 8   Lactation Tools Discussed/Used Tools: Pump;Flanges Flange Size: 18 Breast pump type: Double-Electric Breast Pump;Manual Pump Education: Milk Storage;Setup, frequency, and  cleaning;Other (comment) (DEBP set up by previous LC's) Pumped volume: 5 mL (per mom)  Interventions Interventions: Breast feeding basics reviewed;Skin to skin;Assisted with latch;Breast massage;Hand express;Pre-pump if needed;Reverse pressure;Breast compression;Adjust position;Support pillows;Position options;Hand pump;DEBP;Education;LC Services brochure;CDC Guidelines for Breast Pump Cleaning  Discharge Discharge Education: Engorgement and breast care;Warning signs for feeding baby;Outpatient recommendation;Outpatient Epic message sent;Other (comment) (MOB receptive to come back for [facility] at [facility] and she is aware she will receive a call from [facility] O/P) Pump: Personal;Hands Free;Manual  Consult Status Consult Status: Complete Date: 08/07/23    Kathrin Greathouse 08/07/2023, 10:59 AM

## 2023-08-07 NOTE — Discharge Summary (Signed)
Postpartum Discharge Summary    Patient Name: Alison Hill DOB: Jul 06, 1985 MRN: [id number]  Date of admission: 08/02/2023 Delivery date:08/03/2023 Delivering provider: D'IORIO, Byrd Hesselbach A Date of discharge: 08/07/2023  Admitting diagnosis: Gestational HTN, [redacted]w[redacted]d     Secondary diagnosis:   Gestational hypertension Hyperemesis, multiple meds Depression, on Prozac Rubella non-immune, varicella non-immune AMA Anemia, s/p 2 doses of IV Venofer     Discharge diagnosis: Term Pregnancy Delivered  by Emergency Low transverse C-section for non reassuring fetal status, arrest of dilation and high station                                              Post partum procedures: BP comtrol   Augmentation: Cytotec and cervical balloon followed AROM  Complications: None  Hospital course: Induction of Labor With Cesarean Section   38 y.o. yo G2P1011 at [redacted]w[redacted]d was admitted to the hospital 08/02/2023 for induction of labor for Gestational HTN.  Patient had a labor course significant for progress with Cytotec and AROM but arrest of dilation at 6 cm and cat II FHT. The patient went for cesarean section due to Non-Reassuring FHR. Delivery details are as follows: Membrane Rupture Time/Date: 10:48 PM,08/02/2023  Delivery Method:C-Section, Low Transverse Operative Delivery:N/A Details of operation can be found in separate operative Note.  Patient had a postpartum course complicated by elevated BPs on POD #3. CMP, CBC normal. She was started on Procardia 30mg  XL on POD #3 and additional dose added in PM. On POD 4 BP was better, no symptoms, leg swelling better/ Lasix single dose.  She is ambulating, tolerating a regular diet, passing flatus, and urinating well.  Patient is discharged home in stable condition on 08/07/23.      Newborn Data: Birth date:08/03/2023 Birth time:3:45 AM Gender:Female Living status:Living Apgars:7 ,9  Weight:3610 g                               Magnesium Sulfate received:  No BMZ received: No Rhophylac:N/A WUJ:[id number] before d/c if accepts  T-DaP:Given prenatally Flu: No RSV Vaccine received: No Transfusion:No Immunizations administered:  There is no immunization history on file for this patient.  Physical exam  Vitals:   08/06/23 2220 08/07/23 0513 08/07/23 0615 08/07/23 1009  BP: 130/80 (!) 141/88 117/68 135/88  Pulse: 98 100 (!) 101 (!) 103  Resp:  16    Temp:  98.2 F (36.8 C)    TempSrc:  Oral    SpO2:  100%    Weight:      Height:       General: alert, cooperative, and no distress Lungs CTA CV RRR Lochia: appropriate Uterine Fundus: firm Incision: Healing well with no significant drainage, No significant erythema DVT Evaluation: No evidence of DVT seen on physical exam. Negative Homan's sign. Labs: Lab Results  Component Value Date   WBC 7.9 08/06/2023   HGB 9.9 (L) 08/06/2023   HCT 30.5 (L) 08/06/2023   MCV 84.5 08/06/2023   PLT 458 (H) 08/06/2023      Latest Ref Rng & Units 08/06/2023   11:10 AM  CMP  Glucose 70 - 99 mg/dL 99   BUN 6 - 20 mg/dL 8   Creatinine 9.56 - 2.13 mg/dL 0.86   Sodium 578 - 469 mmol/L 135   Potassium 3.5 -  5.1 mmol/L 4.1   Chloride 98 - 111 mmol/L 103   CO2 22 - 32 mmol/L 23   Calcium 8.9 - 10.3 mg/dL 9.2   Total Protein 6.5 - 8.1 g/dL 5.3   Total Bilirubin 0.0 - 1.2 mg/dL 0.4   Alkaline Phos 38 - 126 U/L 77   AST 15 - 41 U/L 36   ALT 0 - 44 U/L 15    Edinburgh Score:    08/04/2023    7:00 AM  Edinburgh Postnatal Depression Scale Screening Tool  I have been able to laugh and see the funny side of things. 0  I have looked forward with enjoyment to things. 0  I have blamed myself unnecessarily when things went wrong. 1  I have been anxious or worried for no good reason. 1  I have felt scared or panicky for no good reason. 2  Things have been getting on top of me. 0  I have been so unhappy that I have had difficulty sleeping. 0  I have felt sad or miserable. 0  I have been so unhappy  that I have been crying. 0  The thought of harming myself has occurred to me. 0  Edinburgh Postnatal Depression Scale Total 4      After visit meds:  Allergies as of 08/07/2023       Reactions   Pear    Itchy swelling in throat and mouth   Latex Dermatitis   Rash/itchy        Medication List     STOP taking these medications    Folic Acid-Vit B6-Vit B12 2.5-25-1 MG Tabs tablet Commonly known as: FOLBEE   metoCLOPramide 10 MG tablet Commonly known as: REGLAN   scopolamine 1 MG/3DAYS Commonly known as: TRANSDERM-SCOP       TAKE these medications    acetaminophen 500 MG tablet Commonly known as: TYLENOL Take 2 tablets (1,000 mg total) by mouth every 6 (six) hours.   furosemide 20 MG tablet Commonly known as: LASIX Take 1 tablet (20 mg total) by mouth daily for 5 days. Start taking on: August 08, 2023   ibuprofen 600 MG tablet Commonly known as: ADVIL Take 1 tablet (600 mg total) by mouth every 6 (six) hours.   multivitamin-prenatal 27-0.8 MG Tabs tablet Take 1 tablet by mouth daily at 12 noon.   NIFEdipine 30 MG 24 hr tablet Commonly known as: ADALAT CC Take 1 tablet (30 mg total) by mouth every 12 (twelve) hours for 15 days.   oxyCODONE 5 MG immediate release tablet Commonly known as: Oxy IR/ROXICODONE Take 1 tablet (5 mg total) by mouth every 6 (six) hours as needed for up to 5 days for severe pain (pain score 7-10).               Discharge Care Instructions  (From admission, onward)           Start     Ordered   08/07/23 0000  If the dressing is still on your incision site when you go home, remove it on the third day after your surgery date. Remove dressing if it begins to fall off, or if it is dirty or damaged before the third day.        08/07/23 1239             Discharge home in stable condition Infant Feeding: Bottle and Breast Infant Disposition:home with mother Discharge instruction: per After Visit Summary and  Postpartum booklet. Activity: Advance as tolerated.  Pelvic rest for 6 weeks.  Diet: routine diet Anticipated Birth Control: Unsure Postpartum Appointment: 3 days for BP check  Additional Postpartum F/U: 6 wks complete postpartum check  Future Appointments:No future appointments. Follow up Visit:  Follow-up Information     D'Iorio, Antony Salmon, MD Follow up on 08/10/2023.   Specialties: Obstetrics, Gynecology Why: 2/19 or 2/20 for BP check Contact information: [address] [place] [address] [phone]               See CNM Yetta Barre at 6 weeks for complete PP check    08/07/2023 Robley Fries, MD

## 2023-08-10 ENCOUNTER — Telehealth (HOSPITAL_COMMUNITY): Payer: Self-pay | Admitting: *Deleted

## 2023-08-10 NOTE — Telephone Encounter (Signed)
Attempted hospital discharge follow-up call. Left message for patient to return RN call with any questions or concerns. Deforest Hoyles, RN, 08/10/23, 1500
